# Patient Record
Sex: Male | Born: 1990 | Race: White | Hispanic: No | Marital: Single | Smoking: Former smoker
Health system: Southern US, Community
[De-identification: ages and names within clinical notes are randomized; demographics above are authoritative.]

## PROBLEM LIST (undated history)

## (undated) DIAGNOSIS — J45909 Unspecified asthma, uncomplicated: Secondary | ICD-10-CM

## (undated) DIAGNOSIS — E559 Vitamin D deficiency, unspecified: Secondary | ICD-10-CM

## (undated) DIAGNOSIS — R7303 Prediabetes: Secondary | ICD-10-CM

## (undated) DIAGNOSIS — K7010 Alcoholic hepatitis without ascites: Secondary | ICD-10-CM

## (undated) HISTORY — DX: Unspecified asthma, uncomplicated: J45.909

## (undated) HISTORY — PX: TONSILLECTOMY: SUR1361

## (undated) HISTORY — PX: ADENOIDECTOMY: SHX5191

## (undated) HISTORY — DX: Alcoholic hepatitis without ascites: K70.10

## (undated) HISTORY — DX: Prediabetes: R73.03

## (undated) HISTORY — DX: Vitamin D deficiency, unspecified: E55.9

---
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
## 2010-10-13 ENCOUNTER — Emergency Department (HOSPITAL_COMMUNITY)
Admission: EM | Admit: 2010-10-13 | Discharge: 2010-10-13 | Payer: Self-pay | Source: Home / Self Care | Admitting: Family Medicine

## 2011-02-06 NOTE — Op Note (Signed)
NAME:  Manuel Thomas, Manuel Thomas                ACCOUNT NO.:  [id number]   MEDICAL RECORD NO.:  [id number]          PATIENT TYPE:  OBV   LOCATION:  2550                         FACILITY:  [facility]   PHYSICIAN:  Dyke Brackett, [name]    DATE OF BIRTH:  18-Jan-1991   DATE OF PROCEDURE:  12/21/2005  DATE OF DISCHARGE:                                 OPERATIVE REPORT   INDICATIONS FOR PROCEDURE:  Patient presented to the ER with displaced  severe both bones fracture, thought to be amenable to reduction under  anesthesia.  When anesthesia was administered, the splint was removed.  There was a very small punctate wound over the ulna consistent with a very  minor open fracture.  For this reason, I&D was carried out as well.   Initially, provisional reduction, sterile prep and drape, irrigation of a  fracture site over the ulna with the skin being opened additionally about 1  cm.  He had about 1 liter of fluid irrigated into the wound with closure  over 4-0 nylon.  In the fracture itself, he was skeletally immature.  The  fracture could be reduced almost anatomically.  The radius was somewhat  perched at 50 to 75% reduction on the lateral but basically anatomically on  the AP and ulna was anatomically reduced with slight deviation.  It is my  feeling that the patient had acceptable reduction.  He was 14 but had wide  open vices and then this was deemed to be acceptable and splints were  applied with slight flexion and ulnar deviation.  Again, reduction was  confirmed to be acceptable under anesthesia with the splint on and again, a  sterile dressing was placed over the punctate wound, taken to the recovery  room in stable condition.      Dyke Brackett, [name]  Electronically Signed     WDC/MEDQ  D:  12/21/2005  T:  12/23/2005  Job:  [id number]

## 2011-02-06 NOTE — Op Note (Signed)
[place]  Patient:    Manuel Thomas, Manuel Thomas Visit Number: [id number] MRN: [id number]          Service Type: DSU Location: [facility] Attending Physician:  Manuel Thomas Dictated by:   Hyman Bible Pendse, [name] Proc. Date: 10/27/01 Admit Date:  10/27/2001   CC:         Weber Cooks Chestine Spore, [name]                           Operative Report  PREOPERATIVE DIAGNOSIS:  Right indirect inguinal hernia.  POSTOPERATIVE DIAGNOSIS:  Right indirect inguinal hernia.  OPERATION PERFORMED:  Repair of right indirect inguinal hernia.  SURGEON:  Prabhakar D. Levie Heritage, [name]  ASSISTANT:  Nurse.  ANESTHESIA:  Nurse.  DESCRIPTION OF PROCEDURE:  Under satisfactory general anesthesia, patient in supine position, the abdomen and groin regions were thoroughly prepped and draped in the usual manner.  A 2.5 cm long transverse incision was made in the right groin in the distal skin crease.  The skin and subcutaneous tissue were incised.  Bleeders were individually clamped, cut and electrocoagulated. External oblique opened.  The spermatic cord structures were dissected to isolate the indirect inguinal hernia sac.  There was an omental tissue in the sac which was reduced.  The sac was isolated up to its high point, doubly suture ligated with 4-0 silk and excess of the sac was excised.  Hernia repair was carried out by modified Fergusons method with #35 wire interrupted sutures.  A small tear in the external oblique was repaired with 4-0 Vicryl. 0.25% Marcaine with epinephrine was injected locally for postoperative analgesia.  Subcutaneous tissues apposed with 4-0 Vicryl.  Skin closed with 5-0 Monocryl subcuticular sutures.  Steri-Strips applied.  Throughout the procedure, the patients vital signs remained stable.  The patient withstood the procedure well and was transferred to the recovery room in satisfactory general condition. Dictated by:   Hyman Bible Pendse,  [name] Attending Physician:  Manuel Thomas DD:  10/27/01 TD:  10/27/01 Job: [id number]

## 2014-01-03 ENCOUNTER — Encounter: Payer: Self-pay | Admitting: Internal Medicine

## 2014-02-17 DIAGNOSIS — E559 Vitamin D deficiency, unspecified: Secondary | ICD-10-CM | POA: Insufficient documentation

## 2014-02-17 DIAGNOSIS — J45909 Unspecified asthma, uncomplicated: Secondary | ICD-10-CM | POA: Insufficient documentation

## 2014-02-19 ENCOUNTER — Ambulatory Visit (INDEPENDENT_AMBULATORY_CARE_PROVIDER_SITE_OTHER): Payer: 59 | Admitting: Internal Medicine

## 2014-02-19 ENCOUNTER — Encounter: Payer: Self-pay | Admitting: Internal Medicine

## 2014-02-19 VITALS — BP 102/76 | HR 68 | Temp 99.7°F | Resp 16 | Ht 70.0 in | Wt 135.8 lb

## 2014-02-19 DIAGNOSIS — Z111 Encounter for screening for respiratory tuberculosis: Secondary | ICD-10-CM

## 2014-02-19 DIAGNOSIS — E559 Vitamin D deficiency, unspecified: Secondary | ICD-10-CM

## 2014-02-19 DIAGNOSIS — Z0000 Encounter for general adult medical examination without abnormal findings: Secondary | ICD-10-CM

## 2014-02-19 DIAGNOSIS — R7402 Elevation of levels of lactic acid dehydrogenase (LDH): Secondary | ICD-10-CM

## 2014-02-19 DIAGNOSIS — Z1212 Encounter for screening for malignant neoplasm of rectum: Secondary | ICD-10-CM

## 2014-02-19 DIAGNOSIS — Z113 Encounter for screening for infections with a predominantly sexual mode of transmission: Secondary | ICD-10-CM

## 2014-02-19 DIAGNOSIS — R740 Nonspecific elevation of levels of transaminase and lactic acid dehydrogenase [LDH]: Secondary | ICD-10-CM

## 2014-02-19 LAB — BASIC METABOLIC PANEL WITH GFR
BUN: 9 mg/dL (ref 6–23)
CO2: 25 meq/L (ref 19–32)
Calcium: 10.1 mg/dL (ref 8.4–10.5)
Chloride: 103 mEq/L (ref 96–112)
Creat: 1.03 mg/dL (ref 0.50–1.35)
GFR, Est Non African American: >89 mL/min
Glucose, Bld: 91 mg/dL (ref 70–99)
POTASSIUM: 4.1 meq/L (ref 3.5–5.3)
Sodium: 139 mEq/L (ref 135–145)

## 2014-02-19 LAB — LIPID PANEL
CHOLESTEROL: 188 mg/dL (ref 0–200)
HDL: 69 mg/dL (ref >39)
LDL CALC: 88 mg/dL (ref 0–99)
TRIGLYCERIDES: 154 mg/dL — AB (ref <150)
Total CHOL/HDL Ratio: 2.7 Ratio
VLDL: 31 mg/dL (ref 0–40)

## 2014-02-19 LAB — CBC WITH DIFFERENTIAL/PLATELET
Basophils Absolute: 0.1 10*3/uL (ref 0.0–0.1)
Basophils Relative: 1 % (ref 0–1)
EOS ABS: 0.1 10*3/uL (ref 0.0–0.7)
EOS PCT: 2 % (ref 0–5)
HCT: 45.7 % (ref 39.0–52.0)
HEMOGLOBIN: 16.1 g/dL (ref 13.0–17.0)
LYMPHS ABS: 2.2 10*3/uL (ref 0.7–4.0)
LYMPHS PCT: 30 % (ref 12–46)
MCH: 30.8 pg (ref 26.0–34.0)
MCHC: 35.2 g/dL (ref 30.0–36.0)
MCV: 87.5 fL (ref 78.0–100.0)
MONOS PCT: 9 % (ref 3–12)
Monocytes Absolute: 0.7 10*3/uL (ref 0.1–1.0)
NEUTROS ABS: 4.3 10*3/uL (ref 1.7–7.7)
Neutrophils Relative %: 58 % (ref 43–77)
PLATELETS: 266 10*3/uL (ref 150–400)
RBC: 5.22 MIL/uL (ref 4.22–5.81)
RDW: 13.3 % (ref 11.5–15.5)
WBC: 7.4 10*3/uL (ref 4.0–10.5)

## 2014-02-19 LAB — HEPATIC FUNCTION PANEL
ALK PHOS: 64 U/L (ref 39–117)
ALT: 17 U/L (ref 0–53)
AST: 23 U/L (ref 0–37)
Albumin: 4.9 g/dL (ref 3.5–5.2)
BILIRUBIN DIRECT: 0.2 mg/dL (ref 0.0–0.3)
Indirect Bilirubin: 0.6 mg/dL (ref 0.2–1.2)
TOTAL PROTEIN: 7.4 g/dL (ref 6.0–8.3)
Total Bilirubin: 0.8 mg/dL (ref 0.2–1.2)

## 2014-02-19 LAB — VITAMIN B12: Vitamin B-12: 396 pg/mL (ref 211–911)

## 2014-02-19 LAB — TSH: TSH: 1.068 u[IU]/mL (ref 0.350–4.500)

## 2014-02-19 LAB — MAGNESIUM: MAGNESIUM: 2 mg/dL (ref 1.5–2.5)

## 2014-02-19 NOTE — Patient Instructions (Signed)

## 2014-02-19 NOTE — Progress Notes (Signed)
Patient ID: Manuel Thomas, male   DOB: [birth year], 23 y.o.   MRN: [id number]   Annual Screening Comprehensive Examination   This very nice 23 y.o.male presents for complete physical.  Patient has no major health issues.    Finally, patient has history of Vitamin D Deficiency with last vitamin D 23 in 2012 and 20 in 2014 .  No Meds or supplements  No known allergies  Past Medical History  Diagnosis Date  . Asthma   . Prediabetes   . Vitamin D deficiency    Past Surgical History  Procedure Laterality Date  . Tonsillectomy    . Adenoidectomy    . Inguinal hernia repair  2003  . Closed reduction forearm fracture Left 2005   Family History  Problem Relation Age of Onset  . Heart disease Mother   . Heart attack Other   . Arthritis Other   . Hypertension Other   . Diabetes Other   . Stroke Other    History   Social History  . Marital Status: Single    Spouse Name: N/A    Number of Children: N/A  . 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   Social History Main Topics  . Smoking status: Never Smoker   . Smokeless tobacco: Not on file  . Alcohol Use: No  . Drug Use: No  . Sexual Activity: Not on file    ROS Constitutional: Denies fever, chills, weight loss/gain, headaches, insomnia, fatigue, night sweats, and change in appetite. Eyes: Denies redness, blurred vision, diplopia, discharge, itchy, watery eyes.  ENT: Denies discharge, congestion, post nasal drip, epistaxis, sore throat, earache, hearing loss, dental pain, Tinnitus, Vertigo, Sinus pain, snoring.  Cardio: Denies chest pain, palpitations, irregular heartbeat, syncope, dyspnea, diaphoresis, orthopnea, PND, claudication, edema Respiratory: denies cough, dyspnea, DOE, pleurisy, hoarseness, laryngitis, wheezing.  Gastrointestinal: Denies dysphagia, heartburn, reflux, water brash, pain, cramps, nausea, vomiting, bloating, diarrhea, constipation,  hematemesis, melena, hematochezia, jaundice, hemorrhoids Genitourinary: Denies dysuria, frequency, urgency, nocturia, hesitancy, discharge, hematuria, flank pain Breast: Breast lumps, nipple discharge, bleeding.  Musculoskeletal: Denies arthralgia, myalgia, stiffness, Jt. Swelling, pain, limp, and strain/sprain. Skin: Denies puritis, rash, hives, warts, acne, eczema, changing in skin lesion Neuro: Weakness, tremor, incoordination, spasms, paresthesia, pain Psychiatric: Denies confusion, memory loss, sensory loss. Endocrine: Denies change in weight, skin, hair change, nocturia, and paresthesia, diabetic polys, visual blurring, hyper /hypo glycemic episodes.  Heme/Lymph: No excessive bleeding, bruising, enlarged lymph nodes.   Physical Exam  There were no vitals taken for this visit.  General Appearance: Well nourished, in no apparent distress. Eyes: PERRLA, EOMs, conjunctiva no swelling or erythema, normal fundi and vessels. Sinuses: No frontal/maxillary tenderness ENT/Mouth: EACs patent / TMs  nl. Nares clear without erythema, swelling, mucoid exudates. Oral hygiene is good. No erythema, swelling, or exudate. Tongue normal, non-obstructing. Tonsils not swollen or erythematous. Hearing normal.  Neck: Supple, thyroid normal. No bruits, nodes or JVD. Respiratory: Respiratory effort normal.  BS equal and clear bilateral without rales, rhonci, wheezing or stridor. Cardio: Heart sounds are normal with regular rate and rhythm and no murmurs, rubs or gallops. Peripheral pulses are normal and equal bilaterally without edema. No aortic or femoral bruits. Chest: symmetric with normal excursions and percussion. Breasts: Symmetric, without lumps, nipple discharge, retractions, or fibrocystic changes.  Abdomen: Flat, soft, with bowl sounds. Nontender, no guarding, rebound, hernias, masses, or organomegaly.  Lymphatics: Non tender without lymphadenopathy.  Genitourinary:  Musculoskeletal: Full ROM all  peripheral extremities, joint stability, 5/5 strength, and normal gait. Skin: Warm and dry without rashes, lesions, cyanosis, clubbing or  ecchymosis.  Neuro: Cranial nerves intact, reflexes equal bilaterally. Normal muscle tone, no cerebellar symptoms. Sensation intact.  Pysch: Awake and oriented X 3, normal affect, Insight and Judgment appropriate.   Assessment and Plan  1. Annual Screening Examination 2. Vitamin D Deficiency  Continue prudent diet as discussed, weight control, regular exercise, and medications. Routine screening labs and tests as requested with regular follow-up as recommended.

## 2014-02-20 LAB — HEMOGLOBIN A1C
Hgb A1c MFr Bld: 5.3 % (ref <5.7)
MEAN PLASMA GLUCOSE: 105 mg/dL (ref <117)

## 2014-02-20 LAB — HEPATITIS A ANTIBODY, TOTAL: HEP A TOTAL AB: BORDERLINE — AB

## 2014-02-20 LAB — MICROALBUMIN / CREATININE URINE RATIO
Creatinine, Urine: 65.6 mg/dL
MICROALB/CREAT RATIO: 7.6 mg/g (ref 0.0–30.0)
Microalb, Ur: 0.5 mg/dL (ref 0.00–1.89)

## 2014-02-20 LAB — HEPATITIS C ANTIBODY: HCV AB: NEGATIVE

## 2014-02-20 LAB — TESTOSTERONE: TESTOSTERONE: 642 ng/dL (ref 300–890)

## 2014-02-20 LAB — INSULIN, FASTING: Insulin fasting, serum: 6 u[IU]/mL (ref 3–28)

## 2014-02-20 LAB — URINALYSIS, MICROSCOPIC ONLY
Bacteria, UA: NONE SEEN
Casts: NONE SEEN
Crystals: NONE SEEN
SQUAMOUS EPITHELIAL / LPF: NONE SEEN

## 2014-02-20 LAB — HIV ANTIBODY (ROUTINE TESTING W REFLEX): HIV: NONREACTIVE

## 2014-02-20 LAB — RPR

## 2014-02-20 LAB — HEPATITIS B SURFACE ANTIBODY,QUALITATIVE: HEP B S AB: POSITIVE — AB

## 2014-02-20 LAB — HEPATITIS B CORE ANTIBODY, TOTAL: HEP B C TOTAL AB: NONREACTIVE

## 2014-02-20 LAB — VITAMIN D 25 HYDROXY (VIT D DEFICIENCY, FRACTURES): Vit D, 25-Hydroxy: 32 ng/mL (ref 30–89)

## 2014-02-21 LAB — HEPATITIS B E ANTIBODY: Hepatitis Be Antibody: NONREACTIVE

## 2014-03-07 LAB — TB SKIN TEST
Induration: 0 mm
TB SKIN TEST: NEGATIVE

## 2014-11-06 ENCOUNTER — Emergency Department (HOSPITAL_COMMUNITY)
Admission: EM | Admit: 2014-11-06 | Discharge: 2014-11-06 | Disposition: A | Discharge status: Home / Self Care | Payer: 59 | Source: Home / Self Care | Attending: Family Medicine | Admitting: Family Medicine

## 2014-11-06 ENCOUNTER — Emergency Department (INDEPENDENT_AMBULATORY_CARE_PROVIDER_SITE_OTHER): Payer: 59

## 2014-11-06 ENCOUNTER — Encounter (HOSPITAL_COMMUNITY): Payer: Self-pay | Admitting: *Deleted

## 2014-11-06 DIAGNOSIS — S92251A Displaced fracture of navicular [scaphoid] of right foot, initial encounter for closed fracture: Secondary | ICD-10-CM

## 2014-11-06 NOTE — Discharge Instructions (Signed)
You have fractured your navicular bone, which is one of the bones of the wrist. Please use ibuprofen and ice regularly for the next 48 hours. Please call [facility] for a follow-up appointment for your hand/wrist. This group has specific hand specialist. Pelase wear the brace continually until your follow-up appointment.

## 2014-11-06 NOTE — ED Provider Notes (Signed)
CSN: [id number]     Arrival date & time 11/06/14  1000 History   First MD Initiated Contact with Patient 11/06/14 1100     Chief Complaint  Patient presents with  . Wrist Injury   (Consider location/radiation/quality/duration/timing/severity/associated sxs/prior Treatment) HPI  R wrist injury: fell 1 day ago. Larey SeatFell backwards and put out hand to catch self. Immediately painful. Some swelling. Ice and ibuprofen w/ some relief. Pain is constant. Unable to fully flex or extend wrist since injury due to pain. H/o 3 L arm fractures from sports in the past.    Past Medical History  Diagnosis Date  . Asthma   . Prediabetes   . Vitamin D deficiency    Past Surgical History  Procedure Laterality Date  . Tonsillectomy    . Adenoidectomy    . Inguinal hernia repair  2003  . Closed reduction forearm fracture Left 2005   Family History  Problem Relation Age of Onset  . Heart disease Mother   . Heart attack Other   . Arthritis Other   . Hypertension Other   . Diabetes Other   . Stroke Other    History  Substance Use Topics  . Smoking status: Never Smoker   . Smokeless tobacco: Not on file  . Alcohol Use: No    Review of Systems Per HPI with all other pertinent systems negative.   Allergies  Review of patient's allergies indicates no known allergies.  Home Medications   Prior to Admission medications   Not on File   There were no vitals taken for this visit. Physical Exam  Constitutional: He is oriented to person, place, and time. He appears well-developed and well-nourished. No distress.  HENT:  Head: Normocephalic and atraumatic.  Eyes: EOM are normal. Pupils are equal, round, and reactive to light.  Neck: Normal range of motion.  Cardiovascular: Normal rate, normal heart sounds and intact distal pulses.   No murmur heard. Pulmonary/Chest: Effort normal and breath sounds normal.  Abdominal: Soft. Bowel sounds are normal.  Musculoskeletal: Normal range of motion.  R  wrist w/ effusion w/ ttp along the dorsal aspect. Grip strength 4/5 due to pain  Neurological: He is alert and oriented to person, place, and time.  Skin: He is not diaphoretic.  Psychiatric: He has a normal mood and affect. His behavior is normal. Judgment and thought content normal.    ED Course  Procedures (including critical care time) Labs Review Labs Reviewed - No data to display  Imaging Review Dg Wrist Complete Right  11/06/2014   CLINICAL DATA:  Fall yesterday.  Wrist pain  EXAM: RIGHT WRIST - COMPLETE 3+ VIEW  COMPARISON:  None.  FINDINGS: Nondisplaced acute fracture of the proximal pole of the navicular bone. No other fracture. No joint space narrowing or erosion.  IMPRESSION: Nondisplaced fracture proximal pole of the navicular bone.   Electronically Signed   By: Marlan Palauharles  Clark [name]   On: 11/06/2014 11:25     MDM   1. Navicular fracture, right, closed, initial encounter    Ice, NSAIDs, placed in Thumb spica. F/u [place] ortho Dr Amanda PeaGramig.   Precautions given and all questions answered  Shelly Flattenavid Merrell, MD Family Medicine 11/06/2014, 11:43 AM      Ozella Rocksavid J Merrell, MD 11/06/14 [phone]

## 2014-11-06 NOTE — ED Notes (Signed)
Pt  Fell   On  [place]  Ice     yest     -       He has pain  And  Swelling  To  The  r    Wrist        -       He  denys   Any  Other injurys

## 2014-11-19 ENCOUNTER — Other Ambulatory Visit: Payer: Self-pay | Admitting: Orthopedic Surgery

## 2014-11-19 ENCOUNTER — Other Ambulatory Visit: Payer: 59

## 2014-11-19 ENCOUNTER — Ambulatory Visit
Admission: RE | Admit: 2014-11-19 | Discharge: 2014-11-19 | Disposition: A | Discharge status: Home / Self Care | Payer: 59 | Source: Ambulatory Visit | Attending: Orthopedic Surgery | Admitting: Orthopedic Surgery

## 2014-11-19 DIAGNOSIS — S62034D Nondisplaced fracture of proximal third of navicular [scaphoid] bone of right wrist, subsequent encounter for fracture with routine healing: Secondary | ICD-10-CM

## 2015-02-21 ENCOUNTER — Encounter: Payer: Self-pay | Admitting: Internal Medicine

## 2015-02-27 ENCOUNTER — Encounter: Payer: Self-pay | Admitting: Internal Medicine

## 2015-04-29 ENCOUNTER — Encounter: Payer: Self-pay | Admitting: Internal Medicine

## 2015-04-29 ENCOUNTER — Ambulatory Visit (INDEPENDENT_AMBULATORY_CARE_PROVIDER_SITE_OTHER): Payer: 59 | Admitting: Internal Medicine

## 2015-04-29 VITALS — BP 126/76 | HR 60 | Temp 97.5°F | Resp 16 | Ht 70.5 in | Wt 147.6 lb

## 2015-04-29 DIAGNOSIS — R6889 Other general symptoms and signs: Secondary | ICD-10-CM

## 2015-04-29 DIAGNOSIS — E559 Vitamin D deficiency, unspecified: Secondary | ICD-10-CM

## 2015-04-29 DIAGNOSIS — Z111 Encounter for screening for respiratory tuberculosis: Secondary | ICD-10-CM

## 2015-04-29 DIAGNOSIS — R7303 Prediabetes: Secondary | ICD-10-CM

## 2015-04-29 DIAGNOSIS — Z6820 Body mass index (BMI) 20.0-20.9, adult: Secondary | ICD-10-CM

## 2015-04-29 DIAGNOSIS — Z0001 Encounter for general adult medical examination with abnormal findings: Secondary | ICD-10-CM

## 2015-04-29 DIAGNOSIS — R7309 Other abnormal glucose: Secondary | ICD-10-CM | POA: Diagnosis not present

## 2015-04-29 LAB — CBC WITH DIFFERENTIAL/PLATELET
BASOS ABS: 0.1 10*3/uL (ref 0.0–0.1)
BASOS PCT: 1 % (ref 0–1)
EOS ABS: 0.3 10*3/uL (ref 0.0–0.7)
Eosinophils Relative: 4 % (ref 0–5)
HCT: 46.7 % (ref 39.0–52.0)
HEMOGLOBIN: 15.9 g/dL (ref 13.0–17.0)
LYMPHS PCT: 36 % (ref 12–46)
Lymphs Abs: 2.9 10*3/uL (ref 0.7–4.0)
MCH: 31 pg (ref 26.0–34.0)
MCHC: 34 g/dL (ref 30.0–36.0)
MCV: 91 fL (ref 78.0–100.0)
MPV: 10.5 fL (ref 8.6–12.4)
Monocytes Absolute: 0.9 10*3/uL (ref 0.1–1.0)
Monocytes Relative: 11 % (ref 3–12)
Neutro Abs: 3.8 10*3/uL (ref 1.7–7.7)
Neutrophils Relative %: 48 % (ref 43–77)
Platelets: 273 10*3/uL (ref 150–400)
RBC: 5.13 MIL/uL (ref 4.22–5.81)
RDW: 13.5 % (ref 11.5–15.5)
WBC: 8 10*3/uL (ref 4.0–10.5)

## 2015-04-29 LAB — BASIC METABOLIC PANEL WITH GFR
BUN: 8 mg/dL (ref 7–25)
CO2: 24 mmol/L (ref 20–31)
Calcium: 9.6 mg/dL (ref 8.6–10.3)
Chloride: 103 mmol/L (ref 98–110)
Creat: 1 mg/dL (ref 0.60–1.35)
GFR, Est African American: >89 mL/min (ref >=60)
GFR, Est Non African American: >89 mL/min (ref >=60)
Glucose, Bld: 78 mg/dL (ref 65–99)
POTASSIUM: 4.3 mmol/L (ref 3.5–5.3)
Sodium: 138 mmol/L (ref 135–146)

## 2015-04-29 NOTE — Progress Notes (Signed)
Patient ID: Manuel Thomas, male   DOB: [birth year], 24 y.o.   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  Exercises 2 x week. Patient has no major health issues.    Finally, patient has history of Vitamin D Deficiency and last vitamin D was low at 19 in 2012 and  32 in June 2015.    Meds: Vit D 5,000 units daily  Allergies  Allergen Reactions  . Dilaudid [Hydromorphone Hcl]    Past Medical History  Diagnosis Date  . Asthma   . Prediabetes   . Vitamin D deficiency    Immunization History  Administered Date(s) Administered  . PPD Test 02/19/2014  . Td 08/21/2012   Past Surgical History  Procedure Laterality Date  . Tonsillectomy    . Adenoidectomy    . Inguinal hernia repair  2003  . Closed reduction forearm fracture Left 2005   Family History  Problem Relation Age of Onset  . Heart disease Mother   . Heart attack Other   . Arthritis Other   . Hypertension Other   . Diabetes Other   . Stroke Other    History   Social History  . Marital Status: Single    Spouse Name: N/A  . Number of Children: N/A  . 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    Social History Main Topics  . Smoking status: Never Smoker   . Smokeless tobacco: Not on file  . Alcohol Use: No  . Drug Use: No  . Sexual Activity: Not on file    ROS Constitutional: Denies fever, chills, weight loss/gain, headaches, insomnia, fatigue, night sweats, and change in appetite. Eyes: Denies redness, blurred vision, diplopia, discharge, itchy, watery eyes.  ENT: Denies discharge, congestion, post nasal drip, epistaxis, sore throat, earache, hearing loss, dental pain, Tinnitus, Vertigo, Sinus pain, snoring.  Cardio: Denies chest pain, palpitations, irregular heartbeat, syncope, dyspnea, diaphoresis, orthopnea, PND, claudication,  edema Respiratory: denies cough, dyspnea, DOE, pleurisy, hoarseness, laryngitis, wheezing.  Gastrointestinal: Denies dysphagia, heartburn, reflux, water brash, pain, cramps, nausea, vomiting, bloating, diarrhea, constipation, hematemesis, melena, hematochezia, jaundice, hemorrhoids Genitourinary: Denies dysuria, frequency, urgency, nocturia, hesitancy, discharge, hematuria, flank pain Breast: Breast lumps, nipple discharge, bleeding.  Musculoskeletal: Denies arthralgia, myalgia, stiffness, Jt. Swelling, pain, limp, and strain/sprain. Skin: Denies puritis, rash, hives, warts, acne, eczema, changing in skin lesion Neuro: Weakness, tremor, incoordination, spasms, paresthesia, pain Psychiatric: Denies confusion, memory loss, sensory loss. Endocrine: Denies change in weight, skin, hair change, nocturia, and paresthesia, diabetic polys, visual blurring, hyper /hypo glycemic episodes.  Heme/Lymph: No excessive bleeding, bruising, enlarged lymph nodes.   Physical Exam  BP 126/76   Pulse 60  Temp 97.5 F   Resp 16  Ht 5' 10.5"   Wt 147 lb 9.6 oz     BMI 20.87  General Appearance: Well nourished and in no apparent distress. Eyes: PERRLA, EOMs, conjunctiva no swelling or erythema, normal fundi and vessels. Sinuses: No frontal/maxillary tenderness ENT/Mouth: EACs patent / TMs  nl. Nares clear without erythema, swelling, mucoid exudates. Oral hygiene is good. No erythema, swelling, or exudate. Tongue normal, non-obstructing. Tonsils not swollen or erythematous. Hearing normal.  Neck: Supple, thyroid normal. No bruits, nodes or JVD. Respiratory: Respiratory effort normal.  BS equal and clear bilateral without rales, rhonci, wheezing or stridor. Cardio: Heart sounds are  normal with regular rate and rhythm and no murmurs, rubs or gallops. Peripheral pulses are normal and equal bilaterally without edema. No aortic or femoral bruits. Chest: symmetric with normal excursions and percussion. Abdomen: Flat,  soft, with bowl sounds. Nontender, no guarding, rebound, hernias, masses, or organomegaly.  Lymphatics: Non tender without lymphadenopathy.  Genitourinary: No Hernia . Nl Male  & testes Nl/Unremarkable.  Musculoskeletal: Full ROM all peripheral extremities, joint stability, 5/5 strength, and normal gait. Skin: Warm and dry without rashes, lesions, cyanosis, clubbing or  ecchymosis.  Neuro: Cranial nerves intact, reflexes equal bilaterally. Normal muscle tone, no cerebellar symptoms. Sensation intact.  Pysch: Awake and oriented X 3, normal affect, Insight and Judgment appropriate.   Assessment and Plan   1. Encounter for general adult medical examination with abnormal findings  - Urine Microscopic - CBC with Differential/Platelet - BASIC METABOLIC PANEL WITH GFR  2. Vitamin D deficiency  - Vit D  25 hydroxy   3. Screening examination for pulmonary tuberculosis  - TB Skin Test  4. Body mass index (BMI) of 20.0-20.9    5. Prediabetes  Continue prudent diet as discussed, weight control, regular exercise, and medications. Routine screening labs and tests as requested with regular follow-up as recommended.Over 30 minutes of exam, counseling, chart review and critical decision making was performed

## 2015-04-29 NOTE — Patient Instructions (Signed)
Recommend Adult Low dose Aspirin or coated  Aspirin 81 mg daily   To reduce risk of Colon Cancer 20 %,   Skin Cancer 26 % ,   Melanoma 46%   and   Pancreatic cancer 60% ++++++++++++++++++ Vitamin D goal is between 70-100.   Please make sure that you are taking your Vitamin D as directed.   It is very important as a natural anti-inflammatory   helping hair, skin, and nails, as well as reducing stroke and heart attack risk.   It helps your bones and helps with mood.  It also decreases numerous cancer risks so please take it as directed.   Low Vit D is associated with a 200-300% higher risk for CANCER   and 200-300% higher risk for HEART   ATTACK  &  STROKE.   .....................................Marland Kitchen  It is also associated with higher death rate at younger ages,   autoimmune diseases like Rheumatoid arthritis, Lupus, Multiple Sclerosis.     Also many other serious conditions, like depression, Alzheimer's  Dementia, infertility, muscle aches, fatigue, fibromyalgia - just to name a few.  +++++++++++++++++++  Recommend the book "The END of DIETING" by Dr Excell Seltzer   & the book "The END of DIABETES " by Dr Excell Seltzer  At [email or web address] - get book & Audio CD's     Being diabetic has a  300% increased risk for heart attack, stroke, cancer, and alzheimer- type vascular dementia. It is very important that you work harder with diet by avoiding all foods that are white. Avoid white rice (brown & wild rice is OK), white potatoes (sweetpotatoes in moderation is OK), White bread or wheat bread or anything made out of white flour like bagels, donuts, rolls, buns, biscuits, cakes, pastries, cookies, pizza crust, and pasta (made from white flour & egg whites) - vegetarian pasta or spinach or wheat pasta is OK. Multigrain breads like Arnold's or Pepper[employer], or multigrain sandwich thins or flatbreads.  Diet, exercise and weight loss can reverse and cure diabetes in the early stages.   Diet, exercise and weight loss is very important in the control and prevention of complications of diabetes which affects every system in your body, ie. Brain - dementia/stroke, eyes - glaucoma/blindness, heart - heart attack/heart failure, kidneys - dialysis, stomach - gastric paralysis, intestines - malabsorption, nerves - severe painful neuritis, circulation - gangrene & loss of a leg(s), and finally cancer and Alzheimers.    I recommend avoid fried & greasy foods,  sweets/candy, white rice (brown or wild rice or Quinoa is OK), white potatoes (sweet potatoes are OK) - anything made from white flour - bagels, doughnuts, rolls, buns, biscuits,white and wheat breads, pizza crust and traditional pasta made of white flour & egg white(vegetarian pasta or spinach or wheat pasta is OK).  Multi-grain bread is OK - like multi-grain flat bread or sandwich thins. Avoid alcohol in excess. Exercise is also important.    Eat all the vegetables you want - avoid meat, especially red meat and dairy - especially cheese.  Cheese is the most concentrated form of trans-fats which is the worst thing to clog up our arteries. Veggie cheese is OK which can be found in the fresh produce section at [facility] or [employer] or [employer]  ++++++++++++++++++++++++++   Preventive Care for Adults  A healthy lifestyle and preventive care can promote health and wellness. Preventive health guidelines for men include the following key practices:  A routine yearly physical is a good way  to check with your health care provider about your health and preventative screening. It is a chance to share any concerns and updates on your health and to receive a thorough exam.  Visit your dentist for a routine exam and preventative care every 6 months. Brush your teeth twice a day and floss once a day. Good oral hygiene prevents tooth decay and gum disease.  The frequency of eye exams is based on your age, health, family medical history, use  of contact lenses, and other factors. Follow your health care provider's recommendations for frequency of eye exams.  Eat a healthy diet. Foods such as vegetables, fruits, whole grains, low-fat dairy products, and lean protein foods contain the nutrients you need without too many calories. Decrease your intake of foods high in solid fats, added sugars, and salt. Eat the right amount of calories for you.Get information about a proper diet from your health care provider, if necessary.  Regular physical exercise is one of the most important things you can do for your health. Most adults should get at least 150 minutes of moderate-intensity exercise (any activity that increases your heart rate and causes you to sweat) each week. In addition, most adults need muscle-strengthening exercises on 2 or more days a week.  Maintain a healthy weight. The body mass index (BMI) is a screening tool to identify possible weight problems. It provides an estimate of body fat based on height and weight. Your health care provider can find your BMI and can help you achieve or maintain a healthy weight.For adults 20 years and older:  A BMI below 18.5 is considered underweight.  A BMI of 18.5 to 24.9 is normal.  A BMI of 25 to 29.9 is considered overweight.  A BMI of 30 and above is considered obese.  Maintain normal blood lipids and cholesterol levels by exercising and minimizing your intake of saturated fat. Eat a balanced diet with plenty of fruit and vegetables. Blood tests for lipids and cholesterol should begin at age 32 and be repeated every 5 years. If your lipid or cholesterol levels are high, you are over 50, or you are at high risk for heart disease, you may need your cholesterol levels checked more frequently.Ongoing high lipid and cholesterol levels should be treated with medicines if diet and exercise are not working.  If you smoke, find out from your health care provider how to quit. If you do not use  tobacco, do not start.  Lung cancer screening is recommended for adults aged 74-80 years who are at high risk for developing lung cancer because of a history of smoking. A yearly low-dose CT scan of the lungs is recommended for people who have at least a 30-pack-year history of smoking and are a current smoker or have quit within the past 15 years. A pack year of smoking is smoking an average of 1 pack of cigarettes a day for 1 year (for example: 1 pack a day for 30 years or 2 packs a day for 15 years). Yearly screening should continue until the smoker has stopped smoking for at least 15 years. Yearly screening should be stopped for people who develop a health problem that would prevent them from having lung cancer treatment.  If you choose to drink alcohol, do not have more than 2 drinks per day. One drink is considered to be 12 ounces (355 mL) of beer, 5 ounces (148 mL) of wine, or 1.5 ounces (44 mL) of liquor.  High blood pressure  causes heart disease and increases the risk of stroke. Your blood pressure should be checked. Ongoing high blood pressure should be treated with medicines, if weight loss and exercise are not effective.  If you are 45-79 years old, ask your health care provider if you should take aspirin to prevent heart disease.  Diabetes screening involves taking a blood sample to check your fasting blood sugar level. Testing should be considered at a younger age or be carried out more frequently if you are overweight and have at least 1 risk factor for diabetes.  Colorectal cancer can be detected and often prevented. Most routine colorectal cancer screening begins at the age of 50 and continues through age 75. However, your health care provider may recommend screening at an earlier age if you have risk factors for colon cancer. On a yearly basis, your health care provider may provide home test kits to check for hidden blood in the stool. Use of a small camera at the end of a tube to  directly examine the colon (sigmoidoscopy or colonoscopy) can detect the earliest forms of colorectal cancer. Talk to your health care provider about this at age 50, when routine screening begins. Direct exam of the colon should be repeated every 5-10 years through age 75, unless early forms of precancerous polyps or small growths are found.  Screening for abdominal aortic aneurysm (AAA)  are recommended for persons over age 50 who have history of hypertensionor who are current or former smokers.  Talk with your health care provider about prostate cancer screening.  Testicular cancer screening is recommended for adult males. Screening includes self-exam, a health care provider exam, and other screening tests. Consult with your health care provider about any symptoms you have or any concerns you have about testicular cancer.  Use sunscreen. Apply sunscreen liberally and repeatedly throughout the day. You should seek shade when your shadow is shorter than you. Protect yourself by wearing long sleeves, pants, a wide-brimmed hat, and sunglasses year round, whenever you are outdoors.  Once a month, do a whole-body skin exam, using a mirror to look at the skin on your back. Tell your health care provider about new moles, moles that have irregular borders, moles that are larger than a pencil eraser, or moles that have changed in shape or color.  Stay current with required vaccines (immunizations).  Influenza vaccine. All adults should be immunized every year.  Tetanus, diphtheria, and acellular pertussis (Td, Tdap) vaccine. An adult who has not previously received Tdap or who does not know his vaccine status should receive 1 dose of Tdap. This initial dose should be followed by tetanus and diphtheria toxoids (Td) booster doses every 10 years. Adults with an unknown or incomplete history of completing a 3-dose immunization series with Td-containing vaccines should begin or complete a primary immunization  series including a Tdap dose. Adults should receive a Td booster every 10 years.  Zoster vaccine. One dose is recommended for adults aged 60 years or older unless certain conditions are present.    Pneumococcal 13-valent conjugate (PCV13) vaccine. When indicated, a person who is uncertain of his immunization history and has no record of immunization should receive the PCV13 vaccine. An adult aged 19 years or older who has certain medical conditions and has not been previously immunized should receive 1 dose of PCV13 vaccine. This PCV13 should be followed with a dose of pneumococcal polysaccharide (PPSV23) vaccine. The PPSV23 vaccine dose should be obtained at least 8 weeks after the dose   of PCV13 vaccine. An adult aged 19 years or older who has certain medical conditions and previously received 1 or more doses of PPSV23 vaccine should receive 1 dose of PCV13. The PCV13 vaccine dose should be obtained 1 or more years after the last PPSV23 vaccine dose.    Pneumococcal polysaccharide (PPSV23) vaccine. When PCV13 is also indicated, PCV13 should be obtained first. All adults aged 65 years and older should be immunized. An adult younger than age 65 years who has certain medical conditions should be immunized. Any person who resides in a nursing home or long-term care facility should be immunized. An adult smoker should be immunized. People with an immunocompromised condition and certain other conditions should receive both PCV13 and PPSV23 vaccines. People with human immunodeficiency virus (HIV) infection should be immunized as soon as possible after diagnosis. Immunization during chemotherapy or radiation therapy should be avoided. Routine use of PPSV23 vaccine is not recommended for American Indians, [place] Natives, or people younger than 65 years unless there are medical conditions that require PPSV23 vaccine. When indicated, people who have unknown immunization and have no record of immunization should  receive PPSV23 vaccine. One-time revaccination 5 years after the first dose of PPSV23 is recommended for people aged 19-64 years who have chronic kidney failure, nephrotic syndrome, asplenia, or immunocompromised conditions. People who received 1-2 doses of PPSV23 before age 65 years should receive another dose of PPSV23 vaccine at age 65 years or later if at least 5 years have passed since the previous dose. Doses of PPSV23 are not needed for people immunized with PPSV23 at or after age 65 years.  Hepatitis A vaccine. Adults who wish to be protected from this disease, have certain high-risk conditions, work with hepatitis A-infected animals, work in hepatitis A research labs, or travel to or work in countries with a high rate of hepatitis A should be immunized. Adults who were previously unvaccinated and who anticipate close contact with an international adoptee during the first 60 days after arrival in the [place] from a country with a high rate of hepatitis A should be immunized.  Hepatitis B vaccine. Adults should be immunized if they wish to be protected from this disease, have certain high-risk conditions, may be exposed to blood or other infectious body fluids, are household contacts or sex partners of hepatitis B positive people, are clients or workers in certain care facilities, or travel to or work in countries with a high rate of hepatitis B.  Preventive Service / Frequency  Ages 19 to 39  Blood pressure check.  Lipid and cholesterol check.  Hepatitis C blood test.** / For any individual with known risks for hepatitis C.  Skin self-exam. / Monthly.  Influenza vaccine. / Every year.  Tetanus, diphtheria, and acellular pertussis (Tdap, Td) vaccine.** / Consult your health care provider. 1 dose of Td every 10 years.  HPV vaccine. / 3 doses over 6 months, if 26 or younger.  Measles, mumps, rubella (MMR) vaccine.** / You need at least 1 dose of MMR if you were born in 1957 or  later. You may also need a second dose.  Pneumococcal 13-valent conjugate (PCV13) vaccine.** / Consult your health care provider.  Pneumococcal polysaccharide (PPSV23) vaccine.** / 1 to 2 doses if you smoke cigarettes or if you have certain conditions.  Meningococcal vaccine.** / 1 dose if you are age 19 to 21 years and a first-year college student living in a residence hall, or have one of several medical conditions. You   may also need additional booster doses.  Hepatitis A vaccine.** / Consult your health care provider.  Hepatitis B vaccine.** / Consult your health care provider.

## 2015-04-30 LAB — URINALYSIS, MICROSCOPIC ONLY
BACTERIA UA: NONE SEEN [HPF]
Casts: NONE SEEN [LPF]
Crystals: NONE SEEN [HPF]
RBC / HPF: NONE SEEN RBC/HPF (ref <=2)
Squamous Epithelial / LPF: NONE SEEN [HPF] (ref <=5)
Yeast: NONE SEEN [HPF]

## 2015-04-30 LAB — VITAMIN D 25 HYDROXY (VIT D DEFICIENCY, FRACTURES): VIT D 25 HYDROXY: 25 ng/mL — AB (ref 30–100)

## 2016-03-17 ENCOUNTER — Encounter: Payer: Self-pay | Admitting: Internal Medicine

## 2016-04-09 ENCOUNTER — Ambulatory Visit (INDEPENDENT_AMBULATORY_CARE_PROVIDER_SITE_OTHER): Payer: 59 | Admitting: Internal Medicine

## 2016-04-09 ENCOUNTER — Encounter: Payer: Self-pay | Admitting: Internal Medicine

## 2016-04-09 VITALS — BP 122/90 | HR 92 | Temp 97.3°F | Resp 16 | Ht 70.0 in | Wt 138.0 lb

## 2016-04-09 DIAGNOSIS — K529 Noninfective gastroenteritis and colitis, unspecified: Secondary | ICD-10-CM

## 2016-04-09 DIAGNOSIS — A09 Infectious gastroenteritis and colitis, unspecified: Secondary | ICD-10-CM | POA: Diagnosis not present

## 2016-04-09 DIAGNOSIS — F411 Generalized anxiety disorder: Secondary | ICD-10-CM | POA: Diagnosis not present

## 2016-04-09 LAB — CBC WITH DIFFERENTIAL/PLATELET
BASOS PCT: 0 %
Basophils Absolute: 0 cells/uL (ref 0–200)
Eosinophils Absolute: 0 cells/uL — ABNORMAL LOW (ref 15–500)
Eosinophils Relative: 0 %
HEMATOCRIT: 49.8 % (ref 38.5–50.0)
Hemoglobin: 17.6 g/dL — ABNORMAL HIGH (ref 13.2–17.1)
Lymphocytes Relative: 15 %
Lymphs Abs: 1740 cells/uL (ref 850–3900)
MCH: 33.1 pg — ABNORMAL HIGH (ref 27.0–33.0)
MCHC: 35.3 g/dL (ref 32.0–36.0)
MCV: 93.8 fL (ref 80.0–100.0)
MONO ABS: 928 {cells}/uL (ref 200–950)
MONOS PCT: 8 %
MPV: 10.9 fL (ref 7.5–12.5)
Neutro Abs: 8932 cells/uL — ABNORMAL HIGH (ref 1500–7800)
Neutrophils Relative %: 77 %
Platelets: 337 10*3/uL (ref 140–400)
RBC: 5.31 MIL/uL (ref 4.20–5.80)
RDW: 13.8 % (ref 11.0–15.0)
WBC: 11.6 10*3/uL — ABNORMAL HIGH (ref 3.8–10.8)

## 2016-04-09 LAB — BASIC METABOLIC PANEL WITH GFR
BUN: 4 mg/dL — ABNORMAL LOW (ref 7–25)
CO2: 22 mmol/L (ref 20–31)
Calcium: 9.9 mg/dL (ref 8.6–10.3)
Chloride: 98 mmol/L (ref 98–110)
Creat: 0.94 mg/dL (ref 0.60–1.35)
GLUCOSE: 132 mg/dL — AB (ref 65–99)
Potassium: 3.5 mmol/L (ref 3.5–5.3)
Sodium: 137 mmol/L (ref 135–146)

## 2016-04-09 LAB — HEPATIC FUNCTION PANEL
ALK PHOS: 81 U/L (ref 40–115)
ALT: 78 U/L — ABNORMAL HIGH (ref 9–46)
AST: 168 U/L — ABNORMAL HIGH (ref 10–40)
Albumin: 4.4 g/dL (ref 3.6–5.1)
BILIRUBIN INDIRECT: 1.4 mg/dL — AB (ref 0.2–1.2)
Bilirubin, Direct: 0.4 mg/dL — ABNORMAL HIGH (ref <=0.2)
TOTAL PROTEIN: 7.2 g/dL (ref 6.1–8.1)
Total Bilirubin: 1.8 mg/dL — ABNORMAL HIGH (ref 0.2–1.2)

## 2016-04-09 MED ORDER — ONDANSETRON HCL 8 MG PO TABS: ORAL_TABLET | ORAL | Status: DC

## 2016-04-09 MED ORDER — SERTRALINE HCL 50 MG PO TABS: ORAL_TABLET | ORAL | Status: DC

## 2016-04-09 MED ORDER — CIPROFLOXACIN HCL 500 MG PO TABS
ORAL_TABLET | ORAL | Status: DC
Start: 2016-04-09 — End: 2016-04-20

## 2016-04-09 MED ORDER — METRONIDAZOLE 500 MG PO TABS: ORAL_TABLET | ORAL | Status: DC

## 2016-04-09 NOTE — Patient Instructions (Signed)
Immodium or Loperamide (anti-diarrheal) 2 mg   Take 1 or 2 tablets up to 12 tablets / 24 hours  ++++++++++++++++++++++++++++++++ Viral Gastroenteritis  Viral gastroenteritis is also known as stomach flu. This condition affects the stomach and intestinal tract. It can cause sudden diarrhea and vomiting. The illness typically lasts 3 to 8 days. Most people develop an immune response that eventually gets rid of the virus. While this natural response develops, the virus can make you quite ill. CAUSES  Many different viruses can cause gastroenteritis, such as rotavirus or noroviruses. You can catch one of these viruses by consuming contaminated food or water. You may also catch a virus by sharing utensils or other personal items with an infected person or by touching a contaminated surface. SYMPTOMS  The most common symptoms are diarrhea and vomiting. These problems can cause a severe loss of body fluids (dehydration) and a body salt (electrolyte) imbalance. Other symptoms may include:  Fever.  Headache.  Fatigue.  Abdominal pain. DIAGNOSIS  Your caregiver can usually diagnose viral gastroenteritis based on your symptoms and a physical exam. A stool sample may also be taken to test for the presence of viruses or other infections. TREATMENT  This illness typically goes away on its own. Treatments are aimed at rehydration. The most serious cases of viral gastroenteritis involve vomiting so severely that you are not able to keep fluids down. In these cases, fluids must be given through an intravenous line (IV). HOME CARE INSTRUCTIONS   Drink enough fluids to keep your urine clear or pale yellow. Drink small amounts of fluids frequently and increase the amounts as tolerated.  Ask your caregiver for specific rehydration instructions.  Avoid:  Foods high in sugar.  Alcohol.  Carbonated drinks.  Tobacco.  Juice.  Caffeine drinks.  Extremely hot or cold fluids.  Fatty, greasy  foods.  Too much intake of anything at one time.  Dairy products until 24 to 48 hours after diarrhea stops.  You may consume probiotics. Probiotics are active cultures of beneficial bacteria. They may lessen the amount and number of diarrheal stools in adults. Probiotics can be found in yogurt with active cultures and in supplements.  Wash your hands well to avoid spreading the virus.  Only take over-the-counter or prescription medicines for pain, discomfort, or fever as directed by your caregiver. Do not give aspirin to children. Antidiarrheal medicines are not recommended.  Ask your caregiver if you should continue to take your regular prescribed and over-the-counter medicines.  Keep all follow-up appointments as directed by your caregiver. SEEK IMMEDIATE MEDICAL CARE IF:   You are unable to keep fluids down.  You do not urinate at least once every 6 to 8 hours.  You develop shortness of breath.  You notice blood in your stool or vomit. This may look like coffee grounds.  You have abdominal pain that increases or is concentrated in one small area (localized).  You have persistent vomiting or diarrhea.  You have a fever.  The patient is a child younger than 3 months, and he or she has a fever.  The patient is a child older than 3 months, and he or she has a fever and persistent symptoms.  The patient is a child older than 3 months, and he or she has a fever and symptoms suddenly get worse.  The patient is a baby, and he or she has no tears when crying. MAKE SURE YOU:   Understand these instructions.  Will watch your condition.  Will get help right away if you are not doing well or get worse.   This information is not intended to replace advice given to you by your health care provider. Make sure you discuss any questions you have with your health care provider.   Document Released: 09/07/2005 Document Revised: 11/30/2011 Document Reviewed: 06/24/2011 Elsevier  Interactive Patient Education [employer].

## 2016-04-09 NOTE — Progress Notes (Signed)
  Subjective:    Patient ID: Manuel Thomas, male    DOB: [birth year], 25 y.o.   MRN: [id number]  HPI  Patient is a nice 25 yo Single WM who presents with 1 month prodrome of intermittent N/V & variable Diarrhea. Reports "no appetite" and denies fever, chills, rashes, melena, hematochezia. Describes a variety of consistencies of stools. A;lso at the end of the visit he becomes taerful repoirting his 25 yo dog just died  And then adds he had a break-up in may of a 3&/2 yr relationship and also that both hos GF & GM had died in the last year and that he has been drinking betw 1/2 to 1 pint of whiskey daily after work.  He does endorse feeling depressed , anxious, and having OCD behaviors and desires treatment. Denies SI.   No Meds   Allergies  Allergen Reactions  . Dilaudid [Hydromorphone Hcl]    Past Medical History  Diagnosis Date  . Asthma   . Prediabetes   . Vitamin D deficiency    Review of Systems 10 point systems review negative except as above.    Objective:   Physical Exam  BP 122/90 mmHg  Pulse 92  Temp(Src) 97.3 F (36.3 C)  Resp 16  Ht 5\' 10"  (1.778 m)  Wt 138 lb (62.596 kg)  BMI 19.80 kg/m2  HEENT - Eac's patent. TM's Nl. EOM's full. PERRLA. NasoOroPharynx clear. Neck - supple. Nl Thyroid. Carotids 2+ & No bruits, nodes, JVD Chest - Clear equal BS w/o Rales, rhonchi, wheezes. Cor - Nl HS. RRR w/o sig MGR. PP 1(+). No edema. Abd - No palpable organomegaly, masses or tenderness. BS nl. MS- FROM w/o deformities. Muscle power, tone and bulk Nl. Gait Nl. Neuro - No obvious Cr N abnormalities. Sensory, motor and Cerebellar functions appear Nl w/o focal abnormalities. Psyche - Mental status tearful. No ideations, delusions, SI or hallucinations.  Skin - clear w/o rash, or icterus.     Assessment & Plan:   1. Gastroenteritis presumed infectious  - Gastrointestinal Pathogen Panel PCR - ondansetron (ZOFRAN) 8 MG tablet; Take 1 tablet 3 x / day before meals for Nausea   Dispense: 30 tablet; Refill: 1 - CBC with Differential/Platelet - ciprofloxacin (CIPRO) 500 MG tablet; Take 1 tablet 2 x / day with food for infection  Dispense: 14 tablet; Refill: 0 - metroNIDAZOLE (FLAGYL) 500 MG tablet; Take 1 tablet 3 x / day for infection  Dispense: 21 tablet; Refill: 0 - BASIC METABOLIC PANEL WITH GFR - Hepatic function panel  2. Anxiety state  - sertraline (ZOLOFT) 50 MG tablet; Take 1 tablet daily - for mood  Dispense: 30 tablet; Refill: 2  ROV - 5 days to recheck.

## 2016-04-14 ENCOUNTER — Encounter: Payer: Self-pay | Admitting: Internal Medicine

## 2016-04-14 ENCOUNTER — Ambulatory Visit (INDEPENDENT_AMBULATORY_CARE_PROVIDER_SITE_OTHER): Payer: 59 | Admitting: Internal Medicine

## 2016-04-14 VITALS — BP 122/90 | HR 8 | Temp 97.6°F | Resp 16 | Ht 70.0 in | Wt 139.8 lb

## 2016-04-14 DIAGNOSIS — R5383 Other fatigue: Secondary | ICD-10-CM | POA: Diagnosis not present

## 2016-04-14 NOTE — Progress Notes (Signed)
  Subjective:    Patient ID: Manuel Thomas, male    DOB: 02/22/1991, 25 y.o.   MRN: [id number]  HPI  This nice 25 yo SWM returns for 5 day f/u after presenting w/ 1 month hx/o intermittent Nausea & Diarrhea and also related some situational or reactive depression and was started on Zoloft. Further, he related several mo hx/o drinking up to 1/2 pint-fifth of whiskey/daily and alleges minimal use over the last few days. He does relate intermittant spells of anxiety thru-out the day and also is having issues with insomnia. . Since last OV, he relates his diarrhea resolved and never completed his GI pathogen stool specimen nor did he take his prescribed Flagyl/Cipro and has improved. Marland Kitchen His CBC 7& BMET were Nl, but LFT's were slightly elevated due to alcohol. He does endorse ongoing issues with fatigue.   Medication Sig  . ondansetron (ZOFRAN) 8 MG tablet Take 1 tablet 3 x / day before meals for Nausea  . sertraline (ZOLOFT) 50 MG tablet Take 1 tablet daily - for mood   Allergies  Allergen Reactions  . Dilaudid [Hydromorphone Hcl]    Past Medical History:  Diagnosis Date  . Asthma   . Prediabetes   . Vitamin D deficiency    Review of Systems  10 point systems review negative except as above.    Objective:   Physical Exam  BP 122/90   Pulse (!) 8   Temp 97.6 F (36.4 C)   Resp 16   Ht 5\' 10"  (1.778 m)   Wt 139 lb 12.8 oz (63.4 kg)   BMI 20.06 kg/m   HEENT - Eac's patent. TM's Nl. EOM's full. PERRLA. NasoOroPharynx clear. Neck - supple. Nl Thyroid. Carotids 2+ & No bruits, nodes, JVD Chest - Clear equal BS w/o Rales, rhonchi, wheezes. Cor - Nl HS. RRR w/o sig MGR. PP 1(+). No edema. MS- FROM w/o deformities. Muscle power, tone and bulk Nl. Gait Nl. Neuro - No obvious Cr N abnormalities. Sensory, motor and Cerebellar functions appear Nl w/o focal abnormalities. Psyche - Mental status flat with forced smiles.   No delusions, ideations, SI  or obvious mood abnormalities.     Assessment & Plan:   1. Other fatigue  - Rx- Alprazolam 0.5 mg #90 - Take 1/2 -1 tablet 3 x /day.- recommended to take 1/2 tab 3 x/da - at meals and 1/2-1 tab at hs. - ROV 1 week to reassess

## 2016-04-20 ENCOUNTER — Encounter: Payer: Self-pay | Admitting: Internal Medicine

## 2016-04-20 ENCOUNTER — Ambulatory Visit (INDEPENDENT_AMBULATORY_CARE_PROVIDER_SITE_OTHER): Payer: 59 | Admitting: Internal Medicine

## 2016-04-20 VITALS — BP 122/76 | HR 92 | Temp 97.5°F | Resp 16 | Ht 70.0 in | Wt 147.8 lb

## 2016-04-20 DIAGNOSIS — R5383 Other fatigue: Secondary | ICD-10-CM | POA: Diagnosis not present

## 2016-04-20 MED ORDER — ALPRAZOLAM 0.5 MG PO TABS: ORAL_TABLET | ORAL | 0 refills | Status: DC

## 2016-04-20 NOTE — Progress Notes (Signed)
  Subjective:    Patient ID: Manuel Thomas, male    DOB: Apr 16, 1991, 25 y.o.   MRN: [id number]  HPI Ofir returns for f/u with Gi sx's of N/V & diarrhea resolved. He's now ion Zoloft x 10-11 days and using the xanax and reports any rare digressions to drink liquor and feels the xanax is helping with the cravings. Denies any tremor or audio-visual perceptions.   Medication Sig  . ondansetron (ZOFRAN) 8 MG tablet Take 1 tablet 3 x / day before meals for Nausea   Alprazolam 0.5 mg  Takes 1 tablet 3 x/ day   . sertraline (ZOLOFT) 50 MG tablet Take 1 tablet daily - for mood   Allergies  Allergen Reactions  . Dilaudid [Hydromorphone Hcl]    Past Medical History:  Diagnosis Date  . Asthma   . Prediabetes   . Vitamin D deficiency    Review of Systems  10 point systems review negative except as above.    Objective:   Physical Exam  BP 122/76   Pulse 92   Temp 97.5 F (36.4 C)   Resp 16   Ht 5\' 10"  (1.778 m)   Wt 147 lb 12.8 oz (67 kg)   BMI 21.21 kg/m    Appears well groomed & in no distress.  HEENT - Eac's patent. TM's Nl. EOM's full. PERRLA. NasoOroPharynx clear. Neck - supple. Nl Thyroid. Carotids 2+ & No bruits, nodes, JVD Chest - Clear equal BS w/o Rales, rhonchi, wheezes. Cor - Nl HS. RRR w/o sig MGR. PP 1(+). No edema. MS- FROM w/o deformities. Muscle power, tone and bulk Nl. Gait Nl. Neuro - No obvious Cr N abnormalities. Sensory, motor and Cerebellar functions appear Nl w/o focal abnormalities. Psyche - Mental status normal & appropriate.  No delusions, ideations or obvious mood abnormalities.    Assessment & Plan:   1. Other fatigue  - continue Zoloft 50 mg  - Rx Alprazolam 0.5 mg   (#135- 0 Rf) to take 1 to 1&1/2 tabs 3 x/da and discussed taper as continues  Zoloft   - encouraged to call Dr Lyanne Co for counseling.

## 2016-04-20 NOTE — Patient Instructions (Addendum)
Alcohol Use Disorder  Alcohol use disorder is a mental disorder. It is not a one-time incident of heavy drinking. Alcohol use disorder is the excessive and uncontrollable use of alcohol over time that leads to problems with functioning in one or more areas of daily living. People with this disorder risk harming themselves and others when they drink to excess. Alcohol use disorder also can cause other mental disorders, such as mood and anxiety disorders, and serious physical problems. People with alcohol use disorder often misuse other drugs.  Alcohol use disorder is common and widespread. Some people with this disorder drink alcohol to cope with or escape from negative life events. Others drink to relieve chronic pain or symptoms of mental illness. People with a family history of alcohol use disorder are at higher risk of losing control and using alcohol to excess.  Drinking too much alcohol can cause injury, accidents, and health problems. One drink can be too much when you are:  Working.  Pregnant or breastfeeding.  Taking medicines. Ask your doctor.  Driving or planning to drive. SYMPTOMS  Signs and symptoms of alcohol use disorder may include the following:   Consumption ofalcohol inlarger amounts or over a longer period of time than intended.  Multiple unsuccessful attempts to cutdown or control alcohol use.   A great deal of time spent obtaining alcohol, using alcohol, or recovering from the effects of alcohol (hangover).  A strong desire or urge to use alcohol (cravings).   Continued use of alcohol despite problems at work, school, or home because of alcohol use.   Continued use of alcohol despite problems in relationships because of alcohol use.  Continued use of alcohol in situations when it is physically hazardous, such as driving a car.  Continued use of alcohol despite awareness of a physical or psychological problem that is likely related to alcohol use. Physical  problems related to alcohol use can involve the brain, heart, liver, stomach, and intestines. Psychological problems related to alcohol use include intoxication, depression, anxiety, psychosis, delirium, and dementia.   The need for increased amounts of alcohol to achieve the same desired effect, or a decreased effect from the consumption of the same amount of alcohol (tolerance).  Withdrawal symptoms upon reducing or stopping alcohol use, or alcohol use to reduce or avoid withdrawal symptoms. Withdrawal symptoms include:  Racing heart.  Hand tremor.  Difficulty sleeping.  Nausea.  Vomiting.  Hallucinations.  Restlessness.  Seizures. DIAGNOSIS Alcohol use disorder is diagnosed through an assessment by your health care provider. Your health care provider may start by asking three or four questions to screen for excessive or problematic alcohol use. To confirm a diagnosis of alcohol use disorder, at least two symptoms must be present within a 66-month period. The severity of alcohol use disorder depends on the number of symptoms:  Mild--two or three.  Moderate--four or five.  Severe--six or more. Your health care provider may perform a physical exam or use results from lab tests to see if you have physical problems resulting from alcohol use. Your health care provider may refer you to a mental health professional for evaluation. TREATMENT  Some people with alcohol use disorder are able to reduce their alcohol use to low-risk levels. Some people with alcohol use disorder need to quit drinking alcohol. When necessary, mental health professionals with specialized training in substance use treatment can help. Your health care provider can help you decide how severe your alcohol use disorder is and what type of treatment you  need. The following forms of treatment are available:   Detoxification. Detoxification involves the use of prescription medicines to prevent alcohol withdrawal  symptoms in the first week after quitting. This is important for people with a history of symptoms of withdrawal and for heavy drinkers who are likely to have withdrawal symptoms. Alcohol withdrawal can be dangerous and, in severe cases, cause death. Detoxification is usually provided in a hospital or in-patient substance use treatment facility.  Counseling or talk therapy. Talk therapy is provided by substance use treatment counselors. It addresses the reasons people use alcohol and ways to keep them from drinking again. The goals of talk therapy are to help people with alcohol use disorder find healthy activities and ways to cope with life stress, to identify and avoid triggers for alcohol use, and to handle cravings, which can cause relapse.  Medicines.Different medicines can help treat alcohol use disorder through the following actions:  Decrease alcohol cravings.  Decrease the positive reward response felt from alcohol use.  Produce an uncomfortable physical reaction when alcohol is used (aversion therapy).  Support groups. Support groups are run by people who have quit drinking. They provide emotional support, advice, and guidance. These forms of treatment are often combined. Some people with alcohol use disorder benefit from intensive combination treatment provided by specialized substance use treatment centers. Both inpatient and outpatient treatment programs are available.

## 2016-05-11 ENCOUNTER — Encounter: Payer: Self-pay | Admitting: Internal Medicine

## 2016-05-11 ENCOUNTER — Ambulatory Visit (INDEPENDENT_AMBULATORY_CARE_PROVIDER_SITE_OTHER): Payer: 59 | Admitting: Internal Medicine

## 2016-05-11 VITALS — BP 124/76 | HR 72 | Temp 97.3°F | Ht 70.0 in | Wt 153.2 lb

## 2016-05-11 DIAGNOSIS — R5383 Other fatigue: Secondary | ICD-10-CM

## 2016-05-11 DIAGNOSIS — F411 Generalized anxiety disorder: Secondary | ICD-10-CM

## 2016-05-11 MED ORDER — SERTRALINE HCL 100 MG PO TABS: ORAL_TABLET | ORAL | 1 refills | Status: DC

## 2016-05-11 NOTE — Progress Notes (Signed)
  Subjective:    Patient ID: Manuel Thomas, male    DOB: 06-16-1991, 25 y.o.   MRN: [id number]  HPI  This very nice 25 yo SWM employed at a country [employer] was recently started on Zoloft 50 mg and had Xanax low dose added to facilitate his transition from excess alcohol consumption & he seems to be doing fairy well alleging no alcohol since 3 week ago OV. Denies any tremors/shakes, hallucinations or strong cravings. Has had issues with fatigue, but general energy level seems also to be improving with restorative sleep since stopping consuming alcohol.   Medication Sig  . ALPRAZolam (XANAX) 0.5 MG tablet Take 1 to 1&1/2 tablets 3 x day for Anxiety  . ondansetron (ZOFRAN) 8 MG tablet Take 1 tablet 3 x / day before meals for Nausea  . sertraline (ZOLOFT) 50 MG tablet Take 1 tablet daily - for mood   Allergies  Allergen Reactions  . Dilaudid [Hydromorphone Hcl]    Past Medical History:  Diagnosis Date  . Asthma   . Prediabetes   . Vitamin D deficiency    Review of Systems  10 point systems review negative except as above.     Objective:   Physical Exam  BP 124/76   Pulse 72   Temp 97.3 F (36.3 C)   Ht 5\' 10"  (1.778 m)   Wt 153 lb 3.2 oz (69.5 kg)   BMI 21.98 kg/m   HEENT -WNL Neck - supple. Nl Thyroid. No bruits, nodes. Chest - Clear equal BS. Cor - Nl HS. RRR w/o sig M. No edema. MS- FROM w/o deformities. Gait Nl. Neuro - No obvious Cr N abnormalities.Nl w/o focal abnormalities. Psyche - Mental status normal & appropriate.  No delusions, ideations or obvious mood abnormalities.    Assessment & Plan:   1. Other fatigue   2. Anxiety state  - sertraline (ZOLOFT) 100 MG tablet; Take 1 tablet daily - for mood  Dispense: 90 tablet; Refill: 1  - discussed slow titration off Alprazolam as have increased dose of Sertraline.  - Discussed meds & SE's  - ROV - 1 month

## 2016-05-18 ENCOUNTER — Encounter: Payer: Self-pay | Admitting: Internal Medicine

## 2016-06-08 ENCOUNTER — Other Ambulatory Visit: Payer: Self-pay | Admitting: Internal Medicine

## 2016-06-22 ENCOUNTER — Ambulatory Visit: Payer: Self-pay | Admitting: Internal Medicine

## 2016-06-23 ENCOUNTER — Encounter: Payer: Self-pay | Admitting: Internal Medicine

## 2016-06-23 ENCOUNTER — Ambulatory Visit: Payer: Self-pay | Admitting: Internal Medicine

## 2016-06-23 ENCOUNTER — Other Ambulatory Visit: Payer: Self-pay | Admitting: Internal Medicine

## 2016-06-23 DIAGNOSIS — R0989 Other specified symptoms and signs involving the circulatory and respiratory systems: Secondary | ICD-10-CM | POA: Insufficient documentation

## 2016-06-23 DIAGNOSIS — E782 Mixed hyperlipidemia: Secondary | ICD-10-CM | POA: Insufficient documentation

## 2016-06-23 NOTE — Patient Instructions (Signed)

## 2016-06-23 NOTE — Progress Notes (Signed)
RESCHEDULED

## 2016-06-29 ENCOUNTER — Ambulatory Visit: Payer: Self-pay | Admitting: Internal Medicine

## 2016-07-20 ENCOUNTER — Other Ambulatory Visit: Payer: Self-pay | Admitting: Internal Medicine

## 2016-07-27 ENCOUNTER — Encounter (HOSPITAL_COMMUNITY): Payer: Self-pay | Admitting: Psychology

## 2016-07-27 ENCOUNTER — Ambulatory Visit (INDEPENDENT_AMBULATORY_CARE_PROVIDER_SITE_OTHER): Payer: 59 | Admitting: Psychology

## 2016-07-27 ENCOUNTER — Other Ambulatory Visit (HOSPITAL_COMMUNITY): Payer: 59

## 2016-07-27 DIAGNOSIS — F1020 Alcohol dependence, uncomplicated: Secondary | ICD-10-CM | POA: Diagnosis not present

## 2016-07-27 DIAGNOSIS — F1994 Other psychoactive substance use, unspecified with psychoactive substance-induced mood disorder: Secondary | ICD-10-CM

## 2016-07-27 DIAGNOSIS — F10280 Alcohol dependence with alcohol-induced anxiety disorder: Secondary | ICD-10-CM | POA: Diagnosis not present

## 2016-07-27 DIAGNOSIS — F1220 Cannabis dependence, uncomplicated: Secondary | ICD-10-CM | POA: Insufficient documentation

## 2016-07-27 NOTE — Progress Notes (Signed)
Comprehensive Clinical Assessment (CCA) Note  07/27/2016 Manuel Thomas [id number]  Visit Diagnosis:      ICD-9-CM ICD-10-CM   1. Alcohol use disorder, severe, dependence (HCC) 303.90 F10.20   2. Substance induced mood disorder (HCC) 292.84 F19.94   3. 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 He reports he also began taking 5mg  Xanax daily a few months ago, as px for anxiety and help with sleep. Patient reports drinking 750 ml whiskey near daily. Patient reports smoking 1-2 oz marijuana daily. He states having suffered from obsessions and worry about "Myna Hidalgo and doing behaviors multiple times to ease worry and gain peace". Patient is seeking entry into CD-IOP and admits he has a current problem with alcohol dependence and family hx of alcoholism. Patient states he has a long hx of drinking and smoking marijuana to ease anxiety on a daily basis. Patient reports his grandfather on his mother's side, with whom he was very close, died 1 year ago. He also recently broke up with his girlfriend of 3 years. Patient states gf verbalized her concern about his problematic drinking after witnessing patient shaking one morning before drinking. Patient admits his drinking contributed to his breakup. Patient states he has taken opiates in the past, only as px after surgeries or medical reasons, and ended usage when px ended. Patient was lucid, engaged, and active during session. He spoke openly and clearly answered all questions that were asked of him. Patient states that he has "always lived with some worry or anxiety that something bad will happen" and the alcohol helped him forget about that worry. Marijuana helped him "sleep and relax". 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 Patient states he loves his job and has been interested in Librarian, academic since [employer]. Patient graduated HS in [employer]. He reports a "very positive, supportive childhood", no trauma, abuse, neglect of any kind. He denies current suicidal ideation but admits he has had vague thoughts of being "better off dead" in his past. Denies current and hx of homicidal intent. His reported strengths are family closeness, family support, good insight into disease of alcoholism, creative, good Visual merchandiser, Culinary Degree awarded, and friends. Patient stated he wants to abstain from all mind-altering chemicals while in program and return to "baseline" to assess his anxiety and find alternative coping strategies. Patient states he is restless at night and exhibits alcohol withdrawal symptoms of sweats, shakes, vomiting, loss of appetite for past few days. He states he is feeling better today and that he last used 1.5mg  Xanax this morning. Patient's goals include build social support for recovery life, attend 12 step meetings 4 times per week, and abstain from all mood altering chemicals. Patient signed all necessary paperwork and discussed program agreement, patient rights, and group rules. Patient completed PHQ 9, CAGE, AUDIT, and GAD7 screenings (attached in EPIC). Counselor showed patient group rooms, restrooms, and patient reports he will return for CD-IOP group Wednesday, 07/29/16. Patient reports he already attended 2 AA meetings and "really enjoyed them and picked up a starter chip". Patient appears to suffer from severe and debilitating anxiety, reporting he often spends "hours worrying about thing outside of his control" and engaging in ritualistic behaviors such as counting his steps and "  doing things in 3's". It appears that alcohol and drugs serve a "self-medicating purpose" and given his family hx of alcoholism, he has reached the point of dependency. Patient last used today.  CCA Part  One  Part One has been completed on paper by the patient.  (See scanned document in Chart Review)  CCA Part Two A  Intake/Chief Complaint:  CCA Intake With Chief Complaint CCA Part Two Date: 07/27/16 CCA Part Two Time: 0921 Chief Complaint/Presenting Problem: Alcoholism, dependency developed 2016 after relationship ended and grandparents died, family hx of alcoholism Patients Currently Reported Symptoms/Problems: alcohol withdrawl, reduced performance at work, reduced passion for work, isolation from intoxication, avoiding family,  Individual's Strengths: creative, HS graduate, Product managerculinary skills, family support, connection to family, likes to learn Type of Services Patient Feels Are Needed: CD-IOP Initial Clinical Notes/Concerns: reliance on benzos and marijuana for stress, sleep  Mental Health Symptoms Depression:   Tired, loss of interest/pleasure  Mania:     Anxiety:   Anxiety: Difficulty concentrating, Restlessness, Tension, Worrying  Psychosis:     Trauma:     Obsessions:  Obsessions: Cause anxiety, Disrupts routine/functioning, Intrusive/time consuming, Recurrent & persistent thoughts/impulses/images, Attempts to suppress/neutralize, Good insight  Compulsions:  Compulsions: "Driven" to perform behaviors/acts, Good insight, Disrupts with routine/functioning, Absent insight/delusional, Intended to reduce stress or prevent another outcome, Intrusive/time consuming, Repeated behaviors/mental acts  Inattention:     Hyperactivity/Impulsivity:     Oppositional/Defiant Behaviors:     Borderline Personality:     Other Mood/Personality Symptoms:  Other Mood/Personality Symtpoms: "fatigue, listless w/o alcohol or xanax"   Mental Status Exam Appearance and self-care  Stature:  Stature: Average  Weight:  Weight: Average weight  Clothing:  Clothing: Meticulous, Neat/clean  Grooming:  Grooming: Well-groomed  Cosmetic use:  Cosmetic Use: None  Posture/gait:  Posture/Gait: Rigid  Motor  activity:  Motor Activity: Not Remarkable  Sensorium  Attention:  Attention: Normal  Concentration:  Concentration: Normal  Orientation:  Orientation: X5  Recall/memory:  Recall/Memory: Normal  Affect and Mood  Affect:  Affect: Blunted, Flat  Mood:     Relating  Eye contact:  Eye Contact: Normal  Facial expression:  Facial Expression: Responsive, Tense  Attitude toward examiner:  Attitude Toward Examiner: Cooperative  Thought and Language  Speech flow: Speech Flow: Normal  Thought content:  Thought Content: Appropriate to mood and circumstances  Preoccupation:  Preoccupations: Obsessions, Guilt, Ruminations  Hallucinations:     Organization:     Company secretaryxecutive Functions  Fund of Knowledge:  Fund of Knowledge: Average  Intelligence:  Intelligence: Average  Abstraction:  Abstraction: CuratorConcrete, Normal  Judgement:  Judgement: Normal, Common-sensical  Reality Testing:  Reality Testing: Adequate  Insight:  Insight: Flashes of insight, Good  Decision Making:  Decision Making: Normal  Social Functioning  Social Maturity:  Social Maturity: Isolates  Social Judgement:  Social Judgement: Normal  Stress  Stressors:  Stressors: Work, Arts administratorMoney (worry about bills, not financially difficult but stressful)  Coping Ability:  Coping Ability: Exhausted, Building surveyorverwhelmed  Skill Deficits:     Supports:      Family and Psychosocial History: Family history Marital status: Single Are you sexually active?: No What is your sexual orientation?: heterosexual Has your sexual activity been affected by drugs, alcohol, medication, or emotional stress?: none Does patient have children?: No  Childhood History:  Childhood History By whom was/is the patient raised?: Both parents Additional childhood history information: "great childhood, went to church every Sunday, parents supportive" Description of patient's relationship with caregiver when they  were a child: Great Patient's description of current relationship with  people who raised him/her: great How were you disciplined when you got in trouble as a child/adolescent?: spanking till 89yo Does patient have siblings?: Yes Number of Siblings: 1 Description of patient's current relationship with siblings: distant, not strained Did patient suffer any verbal/emotional/physical/sexual abuse as a child?: No Did patient suffer from severe childhood neglect?: No Has patient ever been sexually abused/assaulted/raped as an adolescent or adult?: No Was the patient ever a victim of a crime or a disaster?: No Witnessed domestic violence?: No Has patient been effected by domestic violence as an adult?: No  CCA Part Two B  Employment/Work Situation: Employment / Work Psychologist, occupationalituation Employment situation: Leave of absence Where is patient currently employed?: [employer], 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 Patient's job has been impacted by current illness: Yes Describe how patient's job has been impacted: reduced performance, passion What is the longest time patient has a held a job?: 3 years Where was the patient employed at that time?: [employer] Has patient ever been in the Eli Lilly and Companymilitary?: No Has patient ever served in combat?: No Did You Receive Any Psychiatric Treatment/Services While in Equities traderthe Military?: No Are There Guns or Other Weapons in Your Home?: No  Education: Education Last Grade Completed: 2 [employer] of [employer] Did Garment/textile technologistYou Graduate From [employer]?: Yes Did Theme park managerYou Attend College?: Yes What Type of College Degree Do you Have?: Culinary degree Did You Have Any Special Interests In School?: Culinary  Religion: Religion/Spirituality Are You A Religious Person?: No How Might This Affect Treatment?: none  Leisure/Recreation: Leisure / Recreation Leisure and Hobbies: bicycle, [employer], basketball, drumkit, ping pong,   Exercise/Diet: Exercise/Diet Do You Exercise?: Yes What Type of Exercise Do You Do?:  Bike, Weight Training How Many Times a Week Do You Exercise?:  (3-4 times week, ended 6 mo ago) Have You Gained or Lost A Significant Amount of Weight in the Past Six Months?: Yes-Gained Number of Pounds Gained: 10 Do You Have Any Trouble Sleeping?: Yes Explanation of Sleeping Difficulties: restlessness, worry  CCA Part Two C  Alcohol/Drug Use: Alcohol / Drug Use Prescriptions: oxycontin, Hydrocodone, Xanax History of alcohol / drug use?: Yes Longest period of sobriety (when/how long): 24 hours Negative Consequences of Use: Financial, Personal relationships, Work / School Substance #1 Name of Substance 1: alcohol-whiskey 1 - Age of First Use: 14 1 - Amount (size/oz): 750ml 1 - Frequency: daily 1 - Duration: 1 year 1 - Last Use / Amount: 07/24/16 Substance #2 Name of Substance 2: marijuana 2 - Age of First Use: 14 2 - Amount (size/oz): 1 ounce 2 - Frequency: daily 2 - Duration: 6 years 2 - Last Use / Amount: 07/25/16 Substance #3 Name of Substance 3: Benzodiazapine- Xanax 3 - Age of First Use: 24 3 - Amount (size/oz): 5mg  3 - Frequency: daily 3 - Duration: 2 mo 3 - Last Use / Amount: 07/27/16 Substance #4 Name of Substance 4: Opiate- Oxycontin 4 - Age of First Use: 16 4 - Amount (size/oz): as px 4 - Frequency: as px 4 - Duration: 1 month 4 - Last Use / Amount: 2 years ago       CCA Part Three  ASAM's:  Six Dimensions of Multidimensional Assessment  Dimension 1:  Acute Intoxication and/or Withdrawal Potential:     Dimension 2:  Biomedical Conditions and Complications:     Dimension 3:  Emotional, Behavioral, or Cognitive Conditions  and Complications:     Dimension 4:  Readiness to Change:     Dimension 5:  Relapse, Continued use, or Continued Problem Potential:     Dimension 6:  Recovery/Living Environment:      Substance use Disorder (SUD) Substance Use Disorder (SUD)  Checklist Symptoms of Substance Use: Continued use despite having a persistent/recurrent  physical/psychological problem caused/exacerbated by use, Continued use despite persistent or recurrent social, interpersonal problems, caused or exacerbated by use, Evidence of tolerance, Evidence of withdrawal (Comment), Large amounts of time spent to obtain, use or recover from the substance(s), Persistent desire or unsuccessful efforts to cut down or control use, Presence of craving or strong urge to use, Recurrent use that results in a fialure to fulfill major rule obligatinos (work, school, home), Repeated use in physically hazardous situations, Social, occupational, recreational activities given up or reduced due to use, Substance(s) often taken in large amounts or over longer times than was intended  Social Function:  Social Functioning Social Maturity: Isolates Social Judgement: Normal  Stress:  Stress Stressors: Work, Arts administrator (worry about bills, not financially difficult but stressful) Coping Ability: Exhausted, Overwhelmed Patient Takes Medications The Way The Doctor Instructed?: Yes Priority Risk: High Risk  Risk Assessment- Self-Harm Potential: Risk Assessment For Self-Harm Potential Thoughts of Self-Harm: No current thoughts Method: No plan Availability of Means: No access/NA  Risk Assessment -Dangerous to Others Potential: Risk Assessment For Dangerous to Others Potential Method: No Plan Availability of Means: No access or NA Notification Required: No need or identified person  DSM5 Diagnoses: Patient Active Problem List   Diagnosis Date Noted  . 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 Mixed hyperlipidemia 06/23/2016  . Asthma   . Prediabetes   . Vitamin D deficiency     Patient Centered Plan: Patient is on the following Treatment Plan(s):  Chemical Dependency, Substance-Induced Mood disorder  Recommendations for Services/Supports/Treatments: Recommendations for Services/Supports/Treatments Recommendations For Services/Supports/Treatments: CD-IOP Intensive Chemical  Dependency Program  Treatment Plan Summary:    Referrals to Alternative Service(s): Referred to Alternative Service(s):   Place:   Date:   Time:    Referred to Alternative Service(s):   Place:   Date:   Time:    Referred to Alternative Service(s):   Place:   Date:   Time:    Referred to Alternative Service(s):   Place:   Date:   Time:     Charmian Muff

## 2016-07-29 ENCOUNTER — Other Ambulatory Visit (HOSPITAL_COMMUNITY): Payer: 59 | Attending: Medical | Admitting: Psychology

## 2016-07-29 ENCOUNTER — Encounter (HOSPITAL_COMMUNITY): Payer: Self-pay | Admitting: Medical

## 2016-07-29 VITALS — BP 128/76 | HR 75 | Ht 70.5 in | Wt 148.6 lb

## 2016-07-29 DIAGNOSIS — Z833 Family history of diabetes mellitus: Secondary | ICD-10-CM | POA: Insufficient documentation

## 2016-07-29 DIAGNOSIS — F17210 Nicotine dependence, cigarettes, uncomplicated: Secondary | ICD-10-CM | POA: Insufficient documentation

## 2016-07-29 DIAGNOSIS — Z9889 Other specified postprocedural states: Secondary | ICD-10-CM | POA: Insufficient documentation

## 2016-07-29 DIAGNOSIS — R7303 Prediabetes: Secondary | ICD-10-CM | POA: Insufficient documentation

## 2016-07-29 DIAGNOSIS — F1994 Other psychoactive substance use, unspecified with psychoactive substance-induced mood disorder: Secondary | ICD-10-CM | POA: Insufficient documentation

## 2016-07-29 DIAGNOSIS — J45909 Unspecified asthma, uncomplicated: Secondary | ICD-10-CM | POA: Insufficient documentation

## 2016-07-29 DIAGNOSIS — I10 Essential (primary) hypertension: Secondary | ICD-10-CM | POA: Insufficient documentation

## 2016-07-29 DIAGNOSIS — Z79899 Other long term (current) drug therapy: Secondary | ICD-10-CM | POA: Diagnosis not present

## 2016-07-29 DIAGNOSIS — F411 Generalized anxiety disorder: Secondary | ICD-10-CM

## 2016-07-29 DIAGNOSIS — K7010 Alcoholic hepatitis without ascites: Secondary | ICD-10-CM

## 2016-07-29 DIAGNOSIS — Z888 Allergy status to other drugs, medicaments and biological substances status: Secondary | ICD-10-CM | POA: Diagnosis not present

## 2016-07-29 DIAGNOSIS — Z8249 Family history of ischemic heart disease and other diseases of the circulatory system: Secondary | ICD-10-CM | POA: Diagnosis not present

## 2016-07-29 DIAGNOSIS — Z8261 Family history of arthritis: Secondary | ICD-10-CM | POA: Insufficient documentation

## 2016-07-29 DIAGNOSIS — Z6372 Alcoholism and drug addiction in family: Secondary | ICD-10-CM | POA: Diagnosis not present

## 2016-07-29 DIAGNOSIS — F1020 Alcohol dependence, uncomplicated: Secondary | ICD-10-CM

## 2016-07-29 DIAGNOSIS — E782 Mixed hyperlipidemia: Secondary | ICD-10-CM | POA: Diagnosis not present

## 2016-07-29 DIAGNOSIS — F1220 Cannabis dependence, uncomplicated: Secondary | ICD-10-CM | POA: Diagnosis not present

## 2016-07-29 DIAGNOSIS — Z823 Family history of stroke: Secondary | ICD-10-CM | POA: Diagnosis not present

## 2016-07-29 DIAGNOSIS — R0989 Other specified symptoms and signs involving the circulatory and respiratory systems: Secondary | ICD-10-CM

## 2016-07-29 DIAGNOSIS — F12229 Cannabis dependence with intoxication, unspecified: Secondary | ICD-10-CM | POA: Insufficient documentation

## 2016-07-29 DIAGNOSIS — F10280 Alcohol dependence with alcohol-induced anxiety disorder: Secondary | ICD-10-CM | POA: Insufficient documentation

## 2016-07-29 DIAGNOSIS — Z811 Family history of alcohol abuse and dependence: Secondary | ICD-10-CM

## 2016-07-29 HISTORY — DX: Alcoholic hepatitis without ascites: K70.10

## 2016-07-29 MED ORDER — ACAMPROSATE CALCIUM 333 MG PO TBEC: 666.0000 mg | DELAYED_RELEASE_TABLET | Freq: Three times a day (TID) | ORAL | 0 refills | Status: DC

## 2016-07-29 MED ORDER — NALTREXONE HCL 50 MG PO TABS: 50.0000 mg | ORAL_TABLET | Freq: Every day | ORAL | 0 refills | Status: DC

## 2016-07-29 NOTE — Progress Notes (Signed)
Psychiatric Initial Adult Assessment   Patient Identification: Manuel Thomas MRN:  [id number] Date of Evaluation:  07/29/2016 Referral Source: [place]m McKeown MD Chief Complaint:   Chief Complaint    Establish Care; Addiction Problem; Anxiety; Substance induced mood disorder; Family Problem; Hypertension; Hyperlipidemia; ASlcoholic hepatitis     Visit Diagnosis:    ICD-9-CM ICD-10-CM   1. Alcohol use disorder, severe, dependence (HCC) 303.90 F10.20   2. Cannabis use disorder, severe, dependence (HCC) 304.30 F12.20   3. Substance induced mood disorder (HCC) 292.84 F19.94   4. 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 Prediabetes 790.29 R73.03   6. Labile hypertension 401.9 I10   7. Mixed hyperlipidemia 272.2 E78.2   8. Acute alcoholic hepatitis 571.1 K70.10   9. Family history of alcoholism in paternal grandfather V61.41 28Z63.72   2210. Family history of alcoholism in father V61.41 14Z63.72    Subjective" I was going down the wrong path-I was killing myself with alcohol.It was affecting my work,myrelationships,my family. 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 Alcohol use disorder, severe, dependence (HCC) 303.90 F10.20   2. Substance induced mood disorder (HCC) 292.84 F19.94   3. 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 He reports he also began taking  5mg  Xanax daily a few months ago, as px for anxiety and help with sleep. Patient reports drinking 750 ml whiskey near daily. Patient reports smoking 1-2 oz marijuana daily. He states having suffered from obsessions and worry about "Myna HidalgoKarma and doing behaviors multiple times to ease worry and gain peace". Patient is seeking entry into CD-IOP and admits he has a current problem with alcohol dependence and family hx of alcoholism. Patient states he has a long hx of drinking and smoking marijuana to ease anxiety on a daily basis. Patient reports his grandfather on his mother's side, with whom he was very close, died 1 year ago. He also recently broke up with his girlfriend of 3 years. Patient states gf verbalized her concern about his problematic drinking after witnessing patient shaking one morning before drinking. Patient admits his drinking contributed to his breakup. Patient states he has taken opiates in the past, only as px after surgeries or medical reasons, and ended usage when px ended. Patient was lucid, engaged, and active during session. He spoke openly and clearly answered all questions that were asked of him. Patient states that he has "always lived with some worry or anxiety that something bad will happen" and the alcohol helped him forget about that worry. Marijuana helped him "sleep and relax". 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 Patient states he loves his job and has been interested in Librarian, academiccooking professionally since [employer]. [place] He reports a "very  positive, supportive childhood", no trauma, abuse, neglect of any kind. He denies current suicidal ideation but admits he has had vague thoughts of being "better off dead" in his past. Denies current and hx of homicidal intent. His reported strengths are family closeness, family support, good insight into disease of alcoholism, creative, good Visual merchandisercommunicator, Culinary Degree  awarded, and friends. Patient stated he wants to abstain from all mind-altering chemicals while in program and return to "baseline" to assess his anxiety and find alternative coping strategies. Patient states he is restless at night and exhibits alcohol withdrawal symptoms of sweats, shakes, vomiting, loss of appetite for past few days. He states he is feeling better today and that he last used 1.5mg  Xanax this morning. Patient's goals include build social support for recovery life, attend 12 step meetings 4 times per week, and abstain from all mood altering chemicals. Patient signed all necessary paperwork and discussed program agreement, patient rights, and group rules. Patient completed PHQ 9, CAGE, AUDIT, and GAD7 screenings (attached in EPIC). Counselor showed patient group rooms, restrooms, and patient reports he will return for CD-IOP group Wednesday, 07/29/16. Patient reports he already attended 2 AA meetings and "really enjoyed them and picked up a starter chip". Patient appears to suffer from severe and debilitating anxiety, reporting he often spends "hours worrying about thing outside of his control" and engaging in ritualistic behaviors such as counting his steps and "doing things in 3's". It appears that alcohol and drugs serve a "self-medicating purpose" and given his family hx of alcoholism, he has reached the point of dependency. Patient last used today.        Today her reports 4th day off alcohol and 1 day off Pot with improving withdrawal symptoms of only occasional diarrhea,slight HAs and mild tremors.He admits to significant cravings and wishes to try MAT with Naltrexone and Campral. He is currently taking Zoloft for his anxiety with "some" response.He has learned that drinking does not help his breakup . He is glad to be in treatment.  Associated Signs/Symptoms: Cage 4/4+;Audit  Score 34 Consumption 8/12; Dependence 12/12 Social + Almost certainly alcoholic DSM V criteria 11/11 + SUD  Severe Dependence Alcohol and Cannabis  Depression Symptoms:  depressed mood, anhedonia, insomnia, psychomotor retardation, fatigue, feelings of worthlessness/guilt, difficulty concentrating, impaired memory, recurrent thoughts of death, weight loss, decreased appetite, (Hypo) Manic Symptoms:  Impulsivity, Irritable Mood, Labiality of Mood, Anxiety Symptoms:  Excessive Worry,GAD 7 score 18 Psychotic Symptoms:  None PTSD Symptoms: Negative  Past Psychiatric History:NA  Previous Psychotropic Medications: Yes Xanax and Zoloft from PCP  Substance Abuse History in the last 12 months:  Yes.   Substance Abuse History in the last 12 months: Substance Age of 1st Use Last Use Amount Specific Type  Nicotine 14 today PPD Cigs  Alcohol 14 07/24/2016 1/5 Liqour  Cannabis 14 07/25/2016 3.5 gms Pot/smokes  Opiates RX ONLY     Cocaine      Methamphetamines      LSD      Ecstasy      Benzodiazepines 24 07/28/2017 1mg  Xanax  Caffeine      Inhalants      Others:                          Consequences of Substance Abuse: Medical Consequences:  Elevated liver enzymes;Withdrawal Legal Consequences:  None yet Family Consequences:  Lost relationship of 3 years/caused pain to parents and strained family relationships Blackouts:  Denies but  reports short term memory losses DT's: No Withdrawal Symptoms:    Cramps Diaphoresis Diarrhea Headaches Nausea Tremors Vomiting Headaches  Past Medical History:  Past Medical History:  Diagnosis Date  . Asthma   . Prediabetes   . Vitamin D deficiency     Past Surgical History:  Procedure Laterality Date  . ADENOIDECTOMY    . CLOSED REDUCTION FOREARM FRACTURE Left 2005  . INGUINAL HERNIA REPAIR  2003  . TONSILLECTOMY      Family Psychiatric History:Alcoholism in Father and PGF Father sober since 59   Family History:  Family History  Problem Relation Age of Onset  . Heart disease Mother   . Heart attack Other   . Arthritis  Other   . Hypertension Other   . Diabetes Other   . Stroke Other     Social History:   Social History   Social History  . Marital status: Single    Spouse name: N/A  . Number of children: N/A  . Years of education: Associates Degree in [employer]   Social History Main Topics  . Smoking status: Current Every Day Smoker    Types: Cigarettes  . Smokeless tobacco: Never Used  . Alcohol use Yes  . Drug use: No  . Sexual activity: Not on file   Other Topics Concern  . See CCA   Social History Narrative  . Marital status: Single Are you sexually active?: No What is your sexual orientation?: heterosexual Has your sexual activity been affected by drugs, alcohol, medication, or emotional stress?: none Does patient have children?: No Marital status: Single Are you sexually active?: No What is your sexual orientation?: heterosexual Has your sexual activity been affected by drugs, alcohol, medication, or emotional stress?: none Does patient have children?: No Childhood History:  Childhood History By whom was/is the patient raised?: Both parents Additional childhood history information: "great childhood, went to church every Sunday, parents supportive" Description of patient's relationship with caregiver when they were a child: Great Patient's description of current relationship with people who raised him/her: great How were you disciplined when you got in trouble as a child/adolescent?: spanking till 61yo Does patient have siblings?: Yes Number of Siblings: 1 Description of patient's current relationship with siblings: distant, not strained Did patient suffer any verbal/emotional/physical/sexual abuse as a child?: No Did patient suffer from severe childhood neglect?: No Has patient ever been sexually abused/assaulted/raped as an adolescent or adult?: No Was the patient ever a victim of a crime or a disaster?: No Witnessed domestic violence?: No Has patient been effected by  domestic violence as an adult?: No Employment/Work Situation: Employment / Work Psychologist, 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[employer], 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 Patient's job has been impacted by current illness: Yes Describe how patient's job has been impacted: reduced performance, passion What is the longest time patient has a held a job?: 3 years Where was the patient employed at that time?: [employer] Has patient ever been in the [employer]?: No Has patient ever served in combat?: No Did You Receive Any Psychiatric Treatment/Services While in Equities trader?: No Are There Guns or Other Weapons in Your Home?: No Employment/Work Situation: Employment / Work Situation Employment situation: Leave of absence Where is patient currently employed?: [employer], 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 Patient's job has been impacted by current illness: Yes Describe how patient's job has been impacted: reduced performance, passion  What is the longest time patient has a held a job?: 3 years Where was the patient employed at that time?: [employer] Has patient ever been in the [employer]?: No Has patient ever served in combat?: No Did You Receive Any Psychiatric Treatment/Services While in Equities trader?: No Are There Guns or Education officer, community in Your Home?: No   Developmental History:Noncontributory Prenatal History:  Birth History:  Postnatal Infancy: Developmental History: Milestones:  Sit-Up:   Crawl:  Walk:   Speech:  School History: Event organiser History:NONE Hobbies/Interests:Plays drums/Loves cooking/Bike riding/Skateboarding   Additional Social History: See Narrative  Allergies:   Allergies  Allergen Reactions  . Dilaudid [Hydromorphone Hcl]     Metabolic Disorder Labs: Lab Results  Component Value Date   HGBA1C 5.3  02/19/2014   MPG 105 02/19/2014   No results found for: PROLACTIN Lab Results  Component Value Date   CHOL 188 02/19/2014   TRIG 154 (H) 02/19/2014   HDL 69 02/19/2014   CHOLHDL 2.7 02/19/2014   VLDL 31 02/19/2014   LDLCALC 88 02/19/2014     Current Medications: Current Outpatient Prescriptions  Medication Sig Dispense Refill  . acamprosate (CAMPRAL) 333 MG tablet Take 2 tablets (666 mg total) by mouth 3 (three) times daily. With meals 180 tablet 0  . naltrexone (DEPADE) 50 MG tablet Take 1 tablet (50 mg total) by mouth daily. 30 tablet 0  . nystatin (MYCOSTATIN) 100000 UNIT/ML suspension SWISH 4 TO 6 ML AND SWALLOW FOUR TIMES DAILY  0  . ondansetron (ZOFRAN) 8 MG tablet Take 1 tablet 3 x / day before meals for Nausea 30 tablet 1  . sertraline (ZOLOFT) 100 MG tablet Take 1 tablet daily - for mood 90 tablet 1   No current facility-administered medications for this visit.     Neurologic: Headache: Yes Seizure: Negative Paresthesias:Negative  Musculoskeletal: Strength & Muscle Tone: within normal limits Gait & Station: normal Patient leans: N/A Lt handed  Psychiatric Specialty Exam: Review of Systems  Constitutional: Positive for chills, diaphoresis, malaise/fatigue and weight loss. Negative for fever.       Alcohol use and withdrawal  HENT: Negative.  Negative for congestion, ear discharge, ear pain, hearing loss, nosebleeds, sinus pain, sore throat and tinnitus.   Eyes: Positive for photophobia. Negative for blurred vision, double vision, pain, discharge and redness.  Respiratory: Negative for cough, hemoptysis, sputum production, shortness of breath, wheezing and stridor.   Cardiovascular: Negative.  Negative for chest pain, palpitations, orthopnea, claudication, leg swelling and PND.  Gastrointestinal: Positive for diarrhea, nausea and vomiting. Negative for abdominal pain, blood in stool, constipation and melena.       Etoh withdrawal /none in past few days   Genitourinary: Negative.  Negative for dysuria, flank pain, frequency, hematuria and urgency.  Musculoskeletal: Negative.  Negative for back pain, falls, joint pain, myalgias and neck pain.  Skin: Positive for itching. Negative for rash.  Neurological: Positive for tremors, weakness (loss of appetite) and headaches. Negative for dizziness, tingling, sensory change, speech change, focal weakness, seizures and loss of consciousness.  Endo/Heme/Allergies: Negative for environmental allergies and polydipsia. Bruises/bleeds easily.  Psychiatric/Behavioral: Positive for depression, memory loss and substance abuse. Negative for hallucinations and suicidal ideas. The patient is nervous/anxious and has insomnia.     Blood pressure 128/76, pulse 75, height 5' 10.5" (1.791 m), weight 148 lb 9.6 oz (67.4 kg).Body mass index is 21.02 kg/m.  General Appearance: Well Groomed  Eye Contact:  Good  Speech:  Clear and Coherent  Volume:  Normal  Mood:  Slighlty dysphoric/anxious but cooperative  Affect:  Congruent  Thought Process:  Coherent, Goal Directed and Descriptions of Associations: Intact  Orientation:  Full (Time, Place, and Person)  Thought Content:  WDL, Logical and neurotic anxiety  Suicidal Thoughts:  No  Homicidal Thoughts:  No  Memory:  says he has had some short term loss  Judgement:  Impaired  Insight:  Lacking  Psychomotor Activity:  Slight tremor  Concentration:  Concentration: intact for visit and Attention Span: intact for visit  Recall:  Good  Fund of Knowledge:Good except around his addictions  Language: Good  Akathisia:  NA  Handed:  Left  AIMS (if indicated):  NA  Assets:  Communication Skills Desire for Improvement Financial Resources/Insurance Housing Resilience Social Support Talents/Skills Vocational/Educational  ADL's:  Intact  Cognition: WNL  Sleep:  Improving    Treatment Plan Summary: Treatment Plan/Recommendations:  Plan of Care: [facility] CD IOP  Laboratory:   UDS per protocol/others per PCP  Psychotherapy:IOP Group;In dividual and family  Medications: Campral and Naltrexone MAT/DC Xanax Continue Zoloft  Routine PRN Medications:  Negative  Consultations: None  Safety Concerns:  Relapse  Other:  NA    Maryjean Morn, PA-C 11/8/20174:41 PM

## 2016-07-30 ENCOUNTER — Other Ambulatory Visit (HOSPITAL_COMMUNITY): Payer: 59 | Admitting: Psychology

## 2016-07-30 DIAGNOSIS — F1220 Cannabis dependence, uncomplicated: Secondary | ICD-10-CM

## 2016-07-30 DIAGNOSIS — F411 Generalized anxiety disorder: Secondary | ICD-10-CM

## 2016-07-30 DIAGNOSIS — F1020 Alcohol dependence, uncomplicated: Secondary | ICD-10-CM

## 2016-07-30 DIAGNOSIS — F10280 Alcohol dependence with alcohol-induced anxiety disorder: Secondary | ICD-10-CM | POA: Diagnosis not present

## 2016-07-30 DIAGNOSIS — F1994 Other psychoactive substance use, unspecified with psychoactive substance-induced mood disorder: Secondary | ICD-10-CM

## 2016-07-31 NOTE — Progress Notes (Signed)
    Daily Group Progress Note  Program: CD-IOP   07/31/2016 Manuel Thomas [id number]  Diagnosis:  No diagnosis found.   Sobriety Date: 07/26/16  Group Time: 1-2:30  Participation Level: Minimal  Behavioral Response: Appropriate and Sharing  Type of Therapy: Process Group  Interventions: Motivational Interviewing and Solution Focused  Topic: Patients met with counselors for 1.5 hour group process session. Patients were active and engaged. They took turns reporting on their thoughts and feelings in the last 24 hours since our last group meeting. Some reported significant conflicts with friends and loved ones. Some reported on AA/NA attendance. Counselors used open questions, solution focused techniques, and reflection to deepen patients' connections with each other, build support, and grow understanding of recovery life.     Group Time:   Participation Level: Minimal  Behavioral Response: Appropriate and Sharing  Type of Therapy: Psycho-education Group  Interventions: Motivational Interviewing, Solution Focused and Family Systems  Topic: Patients met with counselors for 1.5 hour group psychoeducation session. Patients were active and engaged. Counselors led psychodrama activity entitled "family sculpture". Patients used group members to model their family of origin and describe their childhood to other group members. Counselors used reflection and linking to deepen the groups' feelings of empathy and understanding for past trauma and stress from childhood. Patients reported feeling "relieved and enlightened" after doing the sculptures.   Summary: Patient was somewhat active and engaged in group. He checked-in last and reported that he was still "shy about sharing in group" since he is new. Patient shared about his last 24 hours, in which he attended an [place] meeting, met with a supportive friend for lunch and increased his sharing about recovery. He stated he began reading his  [place] and enjoyed it. He stated he attended a dentist appointment and seems to be engaging in healthy behaviors. Patient stated he went to his gym but was overwhelmed by how crowded it was. Patient displayed an eagerness and growing insight into recovery and how to sustain sobriety. [name], LPCA   UDS collected: No Results:   AA/NA attended?: YesWednesday  Sponsor?: No   [name], LCAS 07/31/2016 12:41 PM

## 2016-08-02 NOTE — Progress Notes (Signed)
    Daily Group Progress Note  Program: CD-IOP   08/02/2016 Jesse Sans [id number]  Diagnosis:  Alcohol use disorder, severe, dependence (HCC)  Cannabis use disorder, severe, dependence ([place])  Substance induced mood disorder ([place])  Anxiety neurosis  Prediabetes  Labile hypertension  Mixed hyperlipidemia  Acute alcoholic hepatitis  Family history of alcoholism in paternal grandfather  Family history of alcoholism in father   Sobriety Date: 11/5  Group Time: 1-2:30 pm  Participation Level: Active  Behavioral Response: Appropriate  Type of Therapy: Process Group  Interventions: Supportive  Topic: Process: Counselors met with patients to discuss recovery-related activities they had engaged in to support their sobriety. A new patient introduced himself to the group and shared reasons for pursuing treatment. All members were active and engaged in the discussion concerning recovery from mind-altering drugs and alcohol. During group today the Investment banker, operational met with a number of members to discuss medication.   Group Time: 2:30-4 pm  Participation Level: Minimal  Behavioral Response: Appropriate  Type of Therapy: Psycho-education Group  Interventions: Systems analyst  Topic: Psychoeducation: A chaplain from [place] met with the group to discuss issues related to relational intimacy in recovery. Intimacy was discussed in the context of family, friends, sponsors, and partners. The counselor led the discussion regarding boundaries or walls that group members use to protect themselves, as well as the pros and cons of doing so. Drug tests were collected from two group members.   Summary: The patient was new to the group today. He presented as active and engaged in the session. He introduced himself to the group and shared some of his reasons for seeking treatment. He admitted  to "being an alcoholic" and that he "had a problem." He reports being excused from  work until December 1st and that he intends to use this time to fully focus on his recovery. The patient admitted he has cravings every day, but has been able to fight them off and has remained sober. The group encouraged him to continue attending meetings. During the psycho-ed with the visiting chaplain, the patient met with the program director. He confirmed he is engaged in [place] and had recently attended 2 [place] meetings.    UDS collected: No Results:   AA/NA attended?: [place] and Tuesday  Sponsor?: No   [name], LCAS 08/02/2016 2:56 PM

## 2016-08-03 ENCOUNTER — Other Ambulatory Visit (HOSPITAL_COMMUNITY): Payer: Self-pay | Admitting: Medical

## 2016-08-03 ENCOUNTER — Other Ambulatory Visit (HOSPITAL_COMMUNITY): Payer: 59 | Admitting: Psychology

## 2016-08-03 DIAGNOSIS — F10280 Alcohol dependence with alcohol-induced anxiety disorder: Secondary | ICD-10-CM | POA: Diagnosis not present

## 2016-08-03 DIAGNOSIS — F1020 Alcohol dependence, uncomplicated: Secondary | ICD-10-CM

## 2016-08-03 DIAGNOSIS — F1220 Cannabis dependence, uncomplicated: Secondary | ICD-10-CM

## 2016-08-03 DIAGNOSIS — F1994 Other psychoactive substance use, unspecified with psychoactive substance-induced mood disorder: Secondary | ICD-10-CM

## 2016-08-04 ENCOUNTER — Encounter (HOSPITAL_COMMUNITY): Payer: Self-pay | Admitting: Psychology

## 2016-08-04 DIAGNOSIS — F1994 Other psychoactive substance use, unspecified with psychoactive substance-induced mood disorder: Secondary | ICD-10-CM

## 2016-08-04 DIAGNOSIS — F1020 Alcohol dependence, uncomplicated: Secondary | ICD-10-CM

## 2016-08-04 DIAGNOSIS — F1220 Cannabis dependence, uncomplicated: Secondary | ICD-10-CM

## 2016-08-04 NOTE — Progress Notes (Signed)
    Daily Group Progress Note  Program: CD-IOP   08/04/2016 MICAL KICKLIGHTER [id number]  Diagnosis:  Alcohol use disorder, severe, dependence ([place])  Cannabis use disorder, severe, dependence ([place])  Substance induced mood disorder ([place])   Sobriety Date: 07/26/16  Group Time: 1-2:30  Participation Level: Active  Behavioral Response: Appropriate and Sharing  Type of Therapy: Process Group  Interventions: CBT, Motivational Interviewing and Solution Focused  Topic: Patients met with counselors and intern for 1.5-hour group process session. Members were active and engaged in session, describing the recent events, thoughts, feelings, and behaviors since the last group. UDS tests were collected from some members. Counselor began session with 5-minute breathing exercise to increase awareness and practice of mindfulness. One patient became upset when pressed to "reveal his pain" and told the group he will "not talk anymore today". Counselors and intern used motivational interviewing and immediacy to help patients share from their present experience.     Group Time: 2:30-4  Participation Level: Active  Behavioral Response: Appropriate and Sharing  Type of Therapy: Psycho-education Group  Interventions: Motivational Interviewing, Solution Focused, Counsellor and Other: Five Love Languages  Topic: Counselors and intern led psychoeducation session on "The Five Love Languages" by [employer]. Counselors taught strategies for effectively communicating a love language and discussed ways to increase patient's intimacy with close friends and loved ones. Patients were given a 10-minute assessment to discover their primary love language. All patients were engaged and shared thoughtfully with each other about intimacy and growing connection with others.    Summary: Patient was active and engaged in group today. He stated that he attended 2 AA meetings. He reported that he shared  his current situation with his older brother who lives in [place]. Patient stated that he had been "hiding and avoiding" telling him the truth for months and decided to be honest. When counselor inquired, patient stated he felt "relieved" to tell his brother and felt supported. Patient described his weekend as "productive and fulfilling" since he worked out, did yard work with his dad, and felt "emotionally connected to his parents, which was better than being drunk around them." Patient verbalized lessening "paranoia about getting caught drinking". Patient stated he has no previous hx of meditation and enjoyed the 5 min mindfulness exercise since it relaxed him. Patient stated his stomach hurts throughout the day and he struggles to fall asleep at night. Patient has started MAT and was given psychoeducation about early side-effects. [name], LPC-A   UDS collected: Yes Results: positive for marijuana  AA/NA attended?: YesMonday  Sponsor?: No   [name], LCAS 08/04/2016 4:40 PM

## 2016-08-05 ENCOUNTER — Encounter (HOSPITAL_COMMUNITY): Payer: Self-pay | Admitting: Psychology

## 2016-08-05 ENCOUNTER — Other Ambulatory Visit (HOSPITAL_COMMUNITY): Payer: 59 | Admitting: Psychology

## 2016-08-05 DIAGNOSIS — F10280 Alcohol dependence with alcohol-induced anxiety disorder: Secondary | ICD-10-CM | POA: Diagnosis not present

## 2016-08-05 DIAGNOSIS — F1020 Alcohol dependence, uncomplicated: Secondary | ICD-10-CM

## 2016-08-05 DIAGNOSIS — F1220 Cannabis dependence, uncomplicated: Secondary | ICD-10-CM

## 2016-08-05 DIAGNOSIS — K7010 Alcoholic hepatitis without ascites: Secondary | ICD-10-CM

## 2016-08-05 NOTE — Progress Notes (Signed)
Manuel Thomas is a 25 y.o. male patient. CD-IOP TREATMENT PLANNING INDIVIDUAL SESSION Counselor met with patient for 50 min individual session to discuss tx plan, sign tx documentation, and discuss goals and plan for patient experience in CD-IOP. Counselor used open questions, reflection, and empathy to build rapport and establish a working relationship. Patient presented as active and engaged in session. He displayed some slight hand twitching which could be due to withdrawal and/or anxiety in session. 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 Patient reported having short bursts of annoying but not debilitating "twitching or jolting" for unknown reasons. Counselor suggested they monitor the behavior and consult Darlyne Russian in one week. Patient stated his parents have verbalized that he seems to be presenting as more engaged, upbeat, and interested in connecting with family members while at home. Patient agreed, and stated this may be because he is "no longer paranoid about getting caught". Patient continued to discuss his feelings of relief and liberation after divulging to his parents and brother the specific consequences of his alcohol addiction. Patient has begun attending a gym to play basketball, cardio workout, and strength training for at least 1-2 hours/ 3 times per week. Patient reports enjoying AA, attending at least 4 meetings/week and "meeting lots of people and getting phone numbers to contact". Counselor then presented tx paperwork and had patient participate in development of tx goals. Patient identified goals as: 1. Need to stay sober 2. Need for social support 3. Need to improve self care deficits 4. Process and relieve symptoms of grief from loss of recent relationship w/ girlfriend of 3 years. Counselor discussed measurable ways to meet these goals. Patient agreed and displayed high  motivation to change his behavior,thoughts,feelings and sustain recovery-related activities. Patient signed tx plan and discussed time for follow up individual session. (See attached Treatment Plan in "media" section of EPIC) Wes Swan, LPCA       [name], LCAS

## 2016-08-06 ENCOUNTER — Ambulatory Visit (INDEPENDENT_AMBULATORY_CARE_PROVIDER_SITE_OTHER): Payer: 59 | Admitting: Internal Medicine

## 2016-08-06 ENCOUNTER — Other Ambulatory Visit (HOSPITAL_COMMUNITY): Payer: 59 | Admitting: Psychology

## 2016-08-06 ENCOUNTER — Encounter: Payer: Self-pay | Admitting: Internal Medicine

## 2016-08-06 VITALS — BP 136/86 | HR 66 | Temp 98.0°F | Resp 16 | Ht 70.0 in | Wt 148.0 lb

## 2016-08-06 DIAGNOSIS — Z1212 Encounter for screening for malignant neoplasm of rectum: Secondary | ICD-10-CM

## 2016-08-06 DIAGNOSIS — E782 Mixed hyperlipidemia: Secondary | ICD-10-CM

## 2016-08-06 DIAGNOSIS — Z0000 Encounter for general adult medical examination without abnormal findings: Secondary | ICD-10-CM

## 2016-08-06 DIAGNOSIS — Z23 Encounter for immunization: Secondary | ICD-10-CM

## 2016-08-06 DIAGNOSIS — E559 Vitamin D deficiency, unspecified: Secondary | ICD-10-CM

## 2016-08-06 DIAGNOSIS — F1220 Cannabis dependence, uncomplicated: Secondary | ICD-10-CM

## 2016-08-06 DIAGNOSIS — R5383 Other fatigue: Secondary | ICD-10-CM

## 2016-08-06 DIAGNOSIS — Z0001 Encounter for general adult medical examination with abnormal findings: Secondary | ICD-10-CM

## 2016-08-06 DIAGNOSIS — R0989 Other specified symptoms and signs involving the circulatory and respiratory systems: Secondary | ICD-10-CM

## 2016-08-06 DIAGNOSIS — F1020 Alcohol dependence, uncomplicated: Secondary | ICD-10-CM

## 2016-08-06 DIAGNOSIS — F10280 Alcohol dependence with alcohol-induced anxiety disorder: Secondary | ICD-10-CM | POA: Diagnosis not present

## 2016-08-06 DIAGNOSIS — R7303 Prediabetes: Secondary | ICD-10-CM

## 2016-08-06 DIAGNOSIS — Z79899 Other long term (current) drug therapy: Secondary | ICD-10-CM

## 2016-08-06 LAB — LIPID PANEL
CHOLESTEROL: 180 mg/dL (ref <200)
HDL: 55 mg/dL (ref >40)
LDL Cholesterol: 104 mg/dL — ABNORMAL HIGH (ref <100)
TRIGLYCERIDES: 106 mg/dL (ref <150)
Total CHOL/HDL Ratio: 3.3 Ratio (ref <5.0)
VLDL: 21 mg/dL (ref <30)

## 2016-08-06 LAB — CBC WITH DIFFERENTIAL/PLATELET
BASOS ABS: 76 {cells}/uL (ref 0–200)
Basophils Relative: 1 %
EOS PCT: 2 %
Eosinophils Absolute: 152 cells/uL (ref 15–500)
HCT: 49.4 % (ref 38.5–50.0)
HEMOGLOBIN: 16.7 g/dL (ref 13.2–17.1)
LYMPHS ABS: 2204 {cells}/uL (ref 850–3900)
LYMPHS PCT: 29 %
MCH: 32.6 pg (ref 27.0–33.0)
MCHC: 33.8 g/dL (ref 32.0–36.0)
MCV: 96.3 fL (ref 80.0–100.0)
MONOS PCT: 10 %
MPV: 10.9 fL (ref 7.5–12.5)
Monocytes Absolute: 760 cells/uL (ref 200–950)
NEUTROS PCT: 58 %
Neutro Abs: 4408 cells/uL (ref 1500–7800)
Platelets: 317 10*3/uL (ref 140–400)
RBC: 5.13 MIL/uL (ref 4.20–5.80)
RDW: 14.8 % (ref 11.0–15.0)
WBC: 7.6 10*3/uL (ref 3.8–10.8)

## 2016-08-06 LAB — CARBOXY-THC,NORMALIZED RATIO
CARBOXY-THC UR: 38 ng/mL
THC/CR RATIO UR: 109 ng/mg{creat}

## 2016-08-06 LAB — BASIC METABOLIC PANEL WITH GFR
BUN: 3 mg/dL — ABNORMAL LOW (ref 7–25)
CALCIUM: 9.7 mg/dL (ref 8.6–10.3)
CO2: 25 mmol/L (ref 20–31)
CREATININE: 0.78 mg/dL (ref 0.60–1.35)
Chloride: 101 mmol/L (ref 98–110)
GFR, Est Non African American: >89 mL/min (ref >=60)
GLUCOSE: 57 mg/dL — AB (ref 65–99)
Potassium: 3.8 mmol/L (ref 3.5–5.3)
SODIUM: 140 mmol/L (ref 135–146)

## 2016-08-06 LAB — URINE DRUG SCREEN 701203 - MAT
6-ACETYLMORPHINE UR: NEGATIVE ng/mL
AMPHETAMINES UR: NEGATIVE ng/mL
Barbiturates: NEGATIVE ng/mL
Benzodiazepines: NEGATIVE ng/mL
Buprenorphine: NEGATIVE ng/mL
CREATININE UR: 35 mg/dL (ref >=20)
Carisoprodol: NEGATIVE ng/mL
Cocaine Metabolite: NEGATIVE ng/mL
ETHYL GLUCURONIDE UR: NEGATIVE ng/mL
Fentanyl: NEGATIVE ng/mL
GABAPENTIN UR: NEGATIVE ug/mL
Methadone: NEGATIVE ng/mL
Nitrites: NEGATIVE ug/mL
OXYCODONE UR: NEGATIVE ng/mL
Opiates: NEGATIVE ng/mL
PHENCYCLIDINE (PCP) UR: NEGATIVE ng/mL
Propoxyphene: NEGATIVE ng/mL
TAPENTADOL UR: NEGATIVE ng/mL
Tramadol: NEGATIVE ng/mL
Urine pH: 6.6 (ref 4.5–8.9)

## 2016-08-06 LAB — TSH: TSH: 2 mIU/L (ref 0.40–4.50)

## 2016-08-06 LAB — HEPATIC FUNCTION PANEL
ALT: 34 U/L (ref 9–46)
AST: 24 U/L (ref 10–40)
Albumin: 4.5 g/dL (ref 3.6–5.1)
Alkaline Phosphatase: 77 U/L (ref 40–115)
BILIRUBIN DIRECT: 0.1 mg/dL (ref <=0.2)
Indirect Bilirubin: 0.4 mg/dL (ref 0.2–1.2)
TOTAL PROTEIN: 6.9 g/dL (ref 6.1–8.1)
Total Bilirubin: 0.5 mg/dL (ref 0.2–1.2)

## 2016-08-06 NOTE — Progress Notes (Signed)
[facility]   Lucky CowboyWilliam Ival Pacer, [name]    Dyanne CarrelAmanda R. Steffanie Dunnollier, P.A.-C      Terri Piedraourtney Forcucci, P.A.-C  [facility]                [address]                [place], [place] [id number] Telephone [phone]) [phone] Telefax [phone] Annual  Screening/Preventative Visit  & Comprehensive Evaluation & Examination     This very nice 24 y.o.male presents for a Screening/Preventative Visit & comprehensive evaluation and management.  Patient is screened p[roactively for elevated BP, lipids, glucose and Vitamin D Deficiency. 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       Patient also has hx/o borderline elevations of BP in the past Patient's BP has been controlled and today's BP is again borderline elevated - 136/86. Patient denies any cardiac symptoms as chest pain, palpitations, shortness of breath, dizziness or ankle swelling.     Patient has hx/o elevated lipids in the past and is screened proactively for hyperlipidemia.  Last lipids were at goal: Lab Results  Component Value Date   CHOL 188 02/19/2014   HDL 69 02/19/2014   LDLCALC 88 02/19/2014   TRIG 154 (H) 02/19/2014   CHOLHDL 2.7 02/19/2014      Patient is screened proactively for prediabetes with hx/o elevated A1c 5.7% in 2013 and patient denies reactive hypoglycemic symptoms, visual blurring, diabetic polys or paresthesias. Last A1c was normal: Lab Results  Component Value Date   HGBA1C 5.3 02/19/2014       Finally, patient has history of Vitamin D Deficiency  In 2012 of "23" and last vitamin D was  Still very very low (goal is betw 70-100): Lab Results  Component Value Date   VD25OH 25 (L) 04/29/2015   Current Outpatient Prescriptions  on File Prior to Visit  Medication Sig  . acamprosate (CAMPRAL) 333 MG tablet Take 2 tablets (666 mg total) by mouth 3 (three) times daily. With meals  . naltrexone (DEPADE) 50 MG tablet Take 1 tablet (50 mg total) by mouth daily.  . sertraline  100 MG tablet Take 1 tablet daily - for mood   Allergies  Allergen Reactions  . Dilaudid [Hydromorphone Hcl]    Past Medical History:  Diagnosis Date  . Asthma   . Prediabetes   . Vitamin D deficiency    Health Maintenance  Topic Date Due  . INFLUENZA VACCINE  04/21/2016  . TETANUS/TDAP  08/21/2022  . HIV Screening  Completed   Immunization History  Administered Date(s) Administered  . Influenza,inj,quad, With Preservative 08/06/2016  . PPD Test 02/19/2014, 04/29/2015  . Td 08/21/2012   Past Surgical History:  Procedure Laterality Date  . ADENOIDECTOMY    . CLOSED REDUCTION FOREARM FRACTURE Left 2005  . INGUINAL HERNIA REPAIR  2003  . TONSILLECTOMY     Family History  Problem Relation Age of Onset  . Heart disease Mother   . Heart attack Other   . Arthritis Other   . Hypertension Other   . Diabetes Other   . Stroke Other    Social History   Social History  . Marital status: Single    Spouse name: N/A  . Number  of children: N/A  . Years of education: N/A   Occupational History  . Not on file.   Social History Main Topics  . Smoking status: Current Every Day Smoker    Types: Cigarettes  . Smokeless tobacco: Never Used  . Alcohol use Yes  . Drug use: No  . Sexual activity: Not Currently    ROS Constitutional: Denies fever, chills, weight loss/gain, headaches, insomnia,  night sweats or change in appetite. Does c/o fatigue. Eyes: Denies redness, blurred vision, diplopia, discharge, itchy or watery eyes.  ENT: Denies discharge, congestion, post nasal drip, epistaxis, sore throat, earache, hearing loss, dental pain, Tinnitus, Vertigo, Sinus pain or snoring.  Cardio: Denies chest pain, palpitations, irregular  heartbeat, syncope, dyspnea, diaphoresis, orthopnea, PND, claudication or edema Respiratory: denies cough, dyspnea, DOE, pleurisy, hoarseness, laryngitis or wheezing.  Gastrointestinal: Denies dysphagia, heartburn, reflux, water brash, pain, cramps, nausea, vomiting, bloating, diarrhea, constipation, hematemesis, melena, hematochezia, jaundice or hemorrhoids Genitourinary: Denies dysuria, frequency, urgency, nocturia, hesitancy, discharge, hematuria or flank pain Musculoskeletal: Denies arthralgia, myalgia, stiffness, Jt. Swelling, pain, limp or strain/sprain. Denies Falls. Skin: Denies puritis, rash, hives, warts, acne, eczema or change in skin lesion Neuro: No weakness, tremor, incoordination, spasms, paresthesia or pain Psychiatric: Denies confusion, memory loss or sensory loss. Denies Depression. Endocrine: Denies change in weight, skin, hair change, nocturia, and paresthesia, diabetic polys, visual blurring or hyper / hypo glycemic episodes.  Heme/Lymph: No excessive bleeding, bruising or enlarged lymph nodes.  Physical Exam  BP 136/86   Pulse 66   Temp 98 F (36.7 C) (Temporal)   Resp 16   Ht 5\' 10"  (1.778 m)   Wt 148 lb (67.1 kg)   BMI 21.24 kg/m   General Appearance: Well nourished, in no apparent distress.  Eyes: PERRLA, EOMs, conjunctiva no swelling or erythema, normal fundi and vessels. Sinuses: No frontal/maxillary tenderness ENT/Mouth: EACs patent / TMs  nl. Nares clear without erythema, swelling, mucoid exudates. Oral hygiene is good. No erythema, swelling, or exudate. Tongue normal, non-obstructing. Tonsils not swollen or erythematous. Hearing normal.  Neck: Supple, thyroid normal. No bruits, nodes or JVD. Respiratory: Respiratory effort normal.  BS equal and clear bilateral without rales, rhonci, wheezing or stridor. Cardio: Heart sounds are normal with regular rate and rhythm and no murmurs, rubs or gallops. Peripheral pulses are normal and equal bilaterally without  edema. No aortic or femoral bruits. Chest: symmetric with normal excursions and percussion.  Abdomen: Soft, with Nl bowel sounds. Nontender, no guarding, rebound, hernias, masses, or organomegaly.  Lymphatics: Non tender without lymphadenopathy.  Musculoskeletal: Full ROM all peripheral extremities, joint stability, 5/5 strength, and normal gait. Skin: Warm and dry without rashes, lesions, cyanosis, clubbing or  ecchymosis.  Neuro: Cranial nerves intact, reflexes equal bilaterally. Normal muscle tone, no cerebellar symptoms. Sensation intact.  Pysch: Alert and oriented X 3 with normal affect, insight and judgment appropriate.   Assessment and Plan  1. Annual Preventative/Screening Exam   - Microalbumin / creatinine urine ratio - POC Hemoccult Bld/Stl  - Urinalysis, Routine w reflex microscopic  - Vitamin B12 - Iron and TIBC - Testosterone - Protime-INR - CBC with Differential/Platelet - BASIC METABOLIC PANEL WITH GFR - Hepatic function panel - Magnesium - Lipid panel - TSH - Hemoglobin A1c - Insulin, random - VITAMIN D 25 Hydroxy  - EKG 12-Lead  2. Labile hypertension  - Microalbumin / creatinine urine ratio - TSH - EKG 12-Lead  3. Mixed hyperlipidemia  - Lipid panel - TSH - EKG 12-Lead  4.  Prediabetes  - Hemoglobin A1c - Insulin, random  5. Vitamin D deficiency  - VITAMIN D 25 Hydroxy   6. Screening for rectal cancer  - POC Hemoccult Bld/Stl   7. Other fatigue  - Vitamin B12 - Iron and TIBC - Testosterone - TSH  8. Medication management  - Urinalysis, Routine w reflex microscopic  - Protime-INR - CBC with Differential/Platelet - BASIC METABOLIC PANEL WITH GFR - Hepatic function panel - Magnesium  9. Need for prophylactic vaccination and inoculation against influenza  - Flu Vaccine QUAD with presevative        Continue prudent diet as discussed, weight control, BP monitoring, regular exercise, and medications as discussed.  Discussed med  effects and SE's. Routine screening labs and tests as requested with regular follow-up as recommended. Over 40 minutes of exam, counseling, chart review and high complex critical decision making was performed. Recommended F/U in 3 months.

## 2016-08-06 NOTE — Patient Instructions (Signed)

## 2016-08-07 LAB — PROTIME-INR
INR: 1
PROTHROMBIN TIME: 10.2 s (ref 9.0–11.5)

## 2016-08-07 LAB — URINALYSIS, ROUTINE W REFLEX MICROSCOPIC
Bilirubin Urine: NEGATIVE
Glucose, UA: NEGATIVE
HGB URINE DIPSTICK: NEGATIVE
Ketones, ur: NEGATIVE
LEUKOCYTES UA: NEGATIVE
NITRITE: NEGATIVE
PH: 6.5 (ref 5.0–8.0)
Protein, ur: NEGATIVE
SPECIFIC GRAVITY, URINE: 1.006 (ref 1.001–1.035)

## 2016-08-07 LAB — HEMOGLOBIN A1C
HEMOGLOBIN A1C: 5 % (ref <5.7)
MEAN PLASMA GLUCOSE: 97 mg/dL

## 2016-08-07 LAB — IRON AND TIBC
%SAT: 32 % (ref 15–60)
Iron: 116 ug/dL (ref 50–195)
TIBC: 365 ug/dL (ref 250–425)
UIBC: 249 ug/dL (ref 125–400)

## 2016-08-07 LAB — INSULIN, RANDOM: Insulin: 2.2 u[IU]/mL (ref 2.0–19.6)

## 2016-08-07 LAB — MICROALBUMIN / CREATININE URINE RATIO
Creatinine, Urine: 54 mg/dL (ref 20–370)
Microalb Creat Ratio: 7 mcg/mg creat (ref <30)
Microalb, Ur: 0.4 mg/dL

## 2016-08-07 LAB — MAGNESIUM: MAGNESIUM: 2.1 mg/dL (ref 1.5–2.5)

## 2016-08-07 LAB — TESTOSTERONE: TESTOSTERONE: 1023 ng/dL — AB (ref 250–827)

## 2016-08-07 LAB — VITAMIN B12: Vitamin B-12: 429 pg/mL (ref 200–1100)

## 2016-08-07 LAB — VITAMIN D 25 HYDROXY (VIT D DEFICIENCY, FRACTURES): VIT D 25 HYDROXY: 31 ng/mL (ref 30–100)

## 2016-08-10 ENCOUNTER — Other Ambulatory Visit (HOSPITAL_COMMUNITY): Payer: 59 | Admitting: Psychology

## 2016-08-10 ENCOUNTER — Encounter (HOSPITAL_COMMUNITY): Payer: Self-pay | Admitting: Psychology

## 2016-08-10 DIAGNOSIS — F1220 Cannabis dependence, uncomplicated: Secondary | ICD-10-CM

## 2016-08-10 DIAGNOSIS — F1020 Alcohol dependence, uncomplicated: Secondary | ICD-10-CM

## 2016-08-10 DIAGNOSIS — F10280 Alcohol dependence with alcohol-induced anxiety disorder: Secondary | ICD-10-CM | POA: Diagnosis not present

## 2016-08-10 NOTE — Progress Notes (Signed)
Daily Group Progress Note  Program: CD-IOP   08/10/2016 Manuel Thomas [id number]  Diagnosis:  No diagnosis found.   Sobriety Date: 11/5  Group Time: 1-2:30 pm  Participation Level: Active  Behavioral Response: Appropriate and Sharing  Type of Therapy: Process Group  Interventions: Supportive  Topic: Process: Counselors met with patients to discuss recovery-related activities they had engaged in to support their sobriety. Two new patients introduced themselves to the group and shared reasons for pursuing treatment. All members were active and engaged in the discussion concerning recovery from mind-altering drugs and alcohol.  Group Time: 2:30-4 pm  Participation Level: Active  Behavioral Response: Appropriate  Type of Therapy: Psycho-education Group  Interventions: Strength-based  Topic: Psychoeducation: Counselors led a discussion on the serenity prayer and how it related to recovery. Patients were asked to list things they had control over verses things they did not and share them with the group. A group member met with the program director to discuss discharge procedures. Counselors concluded the group by discussing holiday scheduling and the importance of prioritizing recovery.   Summary: The patient presented as active and engaged in group. He shared that he had been working out as part of his goal to focus on self-care. Since he has had time off from work, he has focused on getting his apartment in order, getting enough sleep, and eating well. The counselors encouraged him to continue pursuing self-care activities after he returns to work. He shared that he has been "frustrated" when coworkers have asked him if he is "taking [his recovery] seriously this time?" He has been trying to channel this anger into pursuing his recovery - he wants to prove them wrong. He shared that he is working on having positive self-talk. The patient was unable to attend any  meetings.   UDS collected: No Results:   AA/NA attended?: No  Sponsor?: Yes   Ann Evans, LCAS 08/10/2016 5:19 PM 

## 2016-08-12 ENCOUNTER — Encounter (HOSPITAL_COMMUNITY): Payer: Self-pay | Admitting: Psychology

## 2016-08-12 ENCOUNTER — Other Ambulatory Visit (HOSPITAL_COMMUNITY): Payer: 59 | Admitting: Psychology

## 2016-08-12 DIAGNOSIS — F1220 Cannabis dependence, uncomplicated: Secondary | ICD-10-CM

## 2016-08-12 DIAGNOSIS — F1994 Other psychoactive substance use, unspecified with psychoactive substance-induced mood disorder: Secondary | ICD-10-CM

## 2016-08-12 DIAGNOSIS — F10280 Alcohol dependence with alcohol-induced anxiety disorder: Secondary | ICD-10-CM | POA: Diagnosis not present

## 2016-08-12 DIAGNOSIS — F1020 Alcohol dependence, uncomplicated: Secondary | ICD-10-CM
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
## 2016-08-16 ENCOUNTER — Encounter (HOSPITAL_COMMUNITY): Payer: Self-pay | Admitting: Psychology

## 2016-08-16 NOTE — Progress Notes (Signed)
    Daily Group Progress Note  Program: CD-IOP   REAKWON BARREN [id number]  Diagnosis: F10.20  Sobriety Date: 11/5  Group Time: 1-2:30 pm  Participation Level: Active  Behavioral Response: Appropriate and Sharing  Type of Therapy: Process Group  Interventions: Supportive  Topic: Process: The first half of group was spent in process. Members discussed what they are doing to support their recovery and any challenges or obstacles that may have appeared since we last met. A new group member was present today and she introduced herself during this half of group.   Group Time: 2:30-4 pm  Participation Level: Active  Behavioral Response: Appropriate  Type of Therapy: Psycho-education Group  Interventions:   Topic: Psycho-Ed; Emotional Jenga/Graduation: the second half of group was spent in a psycho-ed. The topic was "Emotional Jenga" and consisted of group members circling their chairs around a table with the [place] tower in the middle. Members chose the pieces they drew and were asked to use them in a sentence and how they would address the emotion in recovery. There was good feedback and fun in this exercise. A graduation ceremony was held 20 minutes prior to the end of the session. Members enjoyed brownies and spoke to the graduating member. Her husband had arrived and was also present for this ceremony. She received validating loving compliments and wishes, and the ceremony proved very poignant.   Summary: The patient was attentive and engaged in group today. He had gone to the gym and had a very good workout. He had also had a physical with his PCP and the report was very good. The patient reported that he had attended an [place] meeting last night and it had been a good one. The meeting covered Step 3 and it was very helpful. The patient admitted he feels much better now both physically and mentally. During the [place] activity, the patient shared about himself and was able to identify  how the emotions relate to his life now. He spoke highly of the graduating member and wished her well. This patient is making good progress in early recovery, but he is not working, and his stress level is minimal. It remains to be seen whether he will feel as well once he returns to his place of employment. He responded well to this intervention.   UDS collected: No Results:   AA/NA attended?: YesWednesday  Sponsor?: No   [name], [place] 08/16/2016 12:08 PM

## 2016-08-16 NOTE — Progress Notes (Signed)
    Daily Group Progress Note  Program: CD-IOP   08/12/16 Manuel Thomas [id number]  Diagnosis: F10.20  Sobriety Date:   Group Time: 1-2:30 pm  Participation Level: Active  Behavioral Response: Appropriate and Sharing  Type of Therapy: Process Group  Interventions: Supportive  Topic: Process: The first half of group was spent in process. Group members described the things they had done since we last met to support their sobriety. They were also encouraged to share any thoughts of using, cravings or challenges to their recovery. The program director met with 2 group members and drug tests were collected from every member except for the member who was graduating today. During their check-ins, members shared briefly about their plans over the upcoming holiday weekend.   Group Time: 2:30-4 pm  Participation Level: Active  Behavioral Response: Sharing  Type of Therapy: Psycho-education Group  Interventions: Solution Focused  Topic: Psycho-Ed: Relapse Prevention, Part 2/Graduation: the second half of group was spent in a psycho-ed on relapse prevention. It was a continuation of the psycho-ed on Monday. Members reviewed the handout provided during the last session and a discussion ensued on challenging thoughts of using in early recovery. Members shared openly about some of their ambivalence, which is to be expected. During the last 20 minutes of group today a graduation ceremony was held to honor a member who was completing the program. His wife had arrived and joined the celebration as did a member who had graduated last week. Kind words and brownies were shared as the session ended.   Summary: The patient reported he had attended the "young people's meeting' on Monday evening. A friend whom he had known for a number of years was speaking. He had been sober for two and one-half years and it was a very moving speech. The patient noted that he met some other young men who skateboard  and are in recovery. They are going to skate together and he looks forward to getting to know these fellows. The patient shared that his family was getting together for the holiday at his parent's home. His aunt and uncle will be visiting, and he wants to tell them about his recent entry into treatment. The patient expressed the desire to let his loved ones know about the changes he is making and feels certain they will be pleased with this news. Another group member reminded him that they might be relieved and tell him how worried they had been about him and his drinking. During the psycho-ed, the patient admitted he had ambivalence about never being able to drink again. Other members could relate to this. He was encouraged to create a voice to answer his disease when it calls to him. The patient nodded as he recounted the painful memories and losses that his drinking had caused him. He had kind words to share with the graduating member and noted he would see him soon once he changes his AA schedule and attends more morning AA meetings when he returns to work. The patient responded well to this intervention and continues to make measurable progress in early recovery.   UDS collected: Yes Results: pending  AA/NA attended?: [facility]  Sponsor?: No   [name], [place] 08/16/2016 10:58 AM

## 2016-08-17 ENCOUNTER — Other Ambulatory Visit (HOSPITAL_COMMUNITY): Payer: 59 | Admitting: Psychology

## 2016-08-17 DIAGNOSIS — F10280 Alcohol dependence with alcohol-induced anxiety disorder: Secondary | ICD-10-CM | POA: Diagnosis not present

## 2016-08-17 DIAGNOSIS — F1994 Other psychoactive substance use, unspecified with psychoactive substance-induced mood disorder: Secondary | ICD-10-CM

## 2016-08-17 DIAGNOSIS — F1020 Alcohol dependence, uncomplicated: Secondary | ICD-10-CM

## 2016-08-17 DIAGNOSIS — F1220 Cannabis dependence, uncomplicated: Secondary | ICD-10-CM

## 2016-08-18 ENCOUNTER — Encounter (HOSPITAL_COMMUNITY): Payer: Self-pay | Admitting: Psychology

## 2016-08-18 DIAGNOSIS — F1994 Other psychoactive substance use, unspecified with psychoactive substance-induced mood disorder: Secondary | ICD-10-CM

## 2016-08-18 DIAGNOSIS — F1220 Cannabis dependence, uncomplicated: Secondary | ICD-10-CM

## 2016-08-18 DIAGNOSIS — F1020 Alcohol dependence, uncomplicated: Secondary | ICD-10-CM

## 2016-08-18 NOTE — Progress Notes (Signed)
Manuel Thomas is a 25 y.o. male patient.  CD-IOP INDIVIDUAL COUNSELING SESSION: Patient met with counselor for 1 hour individual session. Patient presented as active and engaged in session. He stated that he felt great and things were really improving for him. He reported that he had not had any significant cravings or triggers to use since our last meeting yesterday. Patient and counselor explored the patient's experience of CD-IOP thus far and detailed the ways that the tx is being helpful. Counselor and patient also discussed ways to improve tx by focusing on remaining goals of processing grief from recent break up with patient girlfriend. Counselor used open questions to learn more about patients past 3 year relationship with girlfriend. Patient stated they were "serious" and believes the relationship suffered from his lack of emotional communication and her lack of commitment. Patient and counselor discussed alternative ways of reacting to negative emotions in a relationship. Patient stated that drinking became his only coping skills for handling negative emotions. Patient reported that he is feeling more "confident in social settings, more sure of himself, and more interested in other people" thanks to group counseling. Patient continues to experience positive results from MAT, reduced cravings, and denies negative or intrusive thoughts. Patient reports that he is happy with his AA meetings and attending 4/week actively. Patient sobriety date remains 07/13/16. [name], LPCA      [name], LCAS

## 2016-08-18 NOTE — Progress Notes (Signed)
    Daily Group Progress Note  Program: CD-IOP   08/18/2016 SHERIFF RODENBERG [id number]  Diagnosis:  Alcohol use disorder, severe, dependence ([place])  Cannabis use disorder, severe, dependence ([place])  Substance induced mood disorder ([place])   Sobriety Date: 11/5  Group Time: 1-2:30  Participation Level: Active  Behavioral Response: Appropriate and Sharing  Type of Therapy: Process Group  Interventions: CBT and Solution Focused  Topic: Counselors met with patients for 1.5 hour group processing session. Patients were active and engaged and discussed the recent holiday break, challenges to their recovery process, and family interactions. UDS were collected from all patients present. 2 group members shared about lapsing over the break. One patient was absent with no known attempt to communicate. Another group member was absent and called to say she was sick at home. Some patients met with program director to discuss MAT.      Group Time: 2:30-4  Participation Level: Active  Behavioral Response: Appropriate and Sharing  Type of Therapy: Psycho-education Group  Interventions: CBT and Solution Focused  Topic: Counselors met with patients for 1.5 hour group psychoeducation session. Counselor led topical discussion on "Adult Children of Alcoholics". Patients identified characteristics of healthy and unhealthy families. Counselor also led a 5 minute guided imagery meditation about "Coping with Change". Patient processed their response to activity and meditation.    Summary: Patient was active and engaged in group session. He reported that he attended 3 12 Step Meetings since our last group. Patient shared that he had a positive holiday with his family and enjoyed spending time with his extended family members. Patient stated others commented on patient's appearance and the quality of time he spent with them. Patient reported he discovered a bottle of whiskey at a holiday party and  was able to successfully avoid drinking it and from turning his thought into a craving to drink. Patient reported he attended church with his family. He also injured himself in a skateboarding accident and reported he would be getting an x-ray on his right wrist soon. [name], LPCA    UDS collected: Yes Results: negative  AA/NA attended?: YesFriday and Saturday  Sponsor?: No   [name], LCAS 08/18/2016 2:10 PM

## 2016-08-19 ENCOUNTER — Other Ambulatory Visit (HOSPITAL_COMMUNITY): Payer: 59 | Admitting: Psychology

## 2016-08-19 DIAGNOSIS — F10280 Alcohol dependence with alcohol-induced anxiety disorder: Secondary | ICD-10-CM | POA: Diagnosis not present

## 2016-08-19 DIAGNOSIS — F1020 Alcohol dependence, uncomplicated: Secondary | ICD-10-CM

## 2016-08-19 DIAGNOSIS — F1994 Other psychoactive substance use, unspecified with psychoactive substance-induced mood disorder: Secondary | ICD-10-CM

## 2016-08-20 ENCOUNTER — Other Ambulatory Visit (HOSPITAL_COMMUNITY): Payer: 59 | Admitting: Psychology

## 2016-08-20 DIAGNOSIS — F10280 Alcohol dependence with alcohol-induced anxiety disorder: Secondary | ICD-10-CM | POA: Diagnosis not present

## 2016-08-20 DIAGNOSIS — F1020 Alcohol dependence, uncomplicated: Secondary | ICD-10-CM

## 2016-08-20 DIAGNOSIS — F1994 Other psychoactive substance use, unspecified with psychoactive substance-induced mood disorder: Secondary | ICD-10-CM

## 2016-08-21 NOTE — Progress Notes (Signed)
    Daily Group Progress Note  Program: CD-IOP   08/21/2016 Manuel Thomas [id number]  Diagnosis:  Alcohol use disorder, severe, dependence ([place])  Substance induced mood disorder ([place])   Sobriety Date: 07/26/16  Group Time: 1-2:30  Participation Level: Active  Behavioral Response: Appropriate and Sharing  Type of Therapy: Process Group  Interventions: CBT  Topic: Counselors met with patients to discuss the activities they had engaged in to support their recovery since the previous session. A new group member introduced himself to the group and shared his reasons for pursuing treatment. Most members were active and engaged in the discussion concerning recovery from mind-altering drugs and alcohol.      Group Time: 2:30-4  Participation Level: Active  Behavioral Response: Appropriate and Sharing  Type of Therapy: Psycho-education Group  Interventions: Strength-based and Supportive  Topic: Counselors continued the discussion from the previous session about characteristics of Adult Children of Alcoholics. The group members finished reading though the laundry list and were asked to identify which characteristics they personally identified with. The counselors then helped the patients create personal measurable goals for their treatment based on the characteristic they identified. Two group members met with the Investment banker, operational. A drug test was collected from one group member. PHQ-9 and GAD assessments were collected from all group members.    Summary: The patient presented as active and engaged in group. He shared that he went to the doctor and learned that his wrist was not broken. He is scheduled to return to work on 12/1 and hopes that his coworkers will see a positive change in him. He is planning to meet with his boss and management to discuss his treatment thus far. The patient shares that he is learning how to be "present" with himself rather than filling alone time with  mind-altering chemicals. This connects to his identified ACOA characteristic that he is afraid of abandonment. He is actively working on this with his Secretary/administrator. PHQ-9: 2 GAD: 3. The patient attended one [place] meeting.  [name], LPCA   UDS collected: No Results: negative  AA/NA attended?: YesTuesday  Sponsor?: No   [name], LCAS 08/21/2016 1:11 PM

## 2016-08-21 NOTE — Progress Notes (Signed)
    Daily Group Progress Note  Program: CD-IOP   08/21/2016 Manuel Thomas [id number]  Diagnosis:  Alcohol use disorder, severe, dependence ([place])  Substance induced mood disorder ([place])   Sobriety Date: 07/26/16  Group Time: 1-2:30  Participation Level: Active  Behavioral Response: Appropriate and Sharing  Type of Therapy: Process Group  Interventions: CBT  Topic: Counselor met with patients for 1.5 hour group process therapy session. Patients were active, engaged, and talked lively. Patients spoke on their recovery and sobriety from mind-altering drugs and alcohol. One UDS was collected from patient.      Group Time: 2:30-4  Participation Level: Active  Behavioral Response: Appropriate and Sharing  Type of Therapy: Psycho-education Group  Interventions: Meditation: MBRP  Topic: Counselor met with patients for 1.5 hour group psychoeducation therapy session. Counselor led discussing on and experience of mindfulness based relapse prevention (MBRP) for using meditation during recovery from mind-altering drugs and alcohol. Patients were moderately interested and presented as tired and somewhat disengaged.   Summary: Patient presented for CD-IOP and was active and engaged during check-in and psychoeducation. He reported that he attended 1 AA meeting and established the young people meeting as his homegroup. He stated he is getting closer to a male friend in [place] [facility] who is also a recent graduate of [facility] CD-I[facility]. Patient presented as hopeful and upbeat stated he is enjoying early recovery thus far and is excited to get back to his job. Patient stated he had a good meeting with his boss about returning to work today and is looking forward to being sober and working. Patient verbalized active plans for his upcoming weekend which included work and [employer]. Patient stated he is still working on getting a sponsor. Patient seemed especially interested and engaged during meditation and  psychoeducation which may reflect his desired goal of increasing his comfort with "time by himself and his self talk". [name], LPCA   UDS collected: No Results: negative  AA/NA attended?: YesWednesday  Sponsor?: No   [name], LCAS 08/21/2016 12:30 PM

## 2016-08-24 ENCOUNTER — Other Ambulatory Visit (HOSPITAL_COMMUNITY): Payer: 59 | Attending: Medical | Admitting: Psychology

## 2016-08-24 DIAGNOSIS — Z79899 Other long term (current) drug therapy: Secondary | ICD-10-CM | POA: Diagnosis not present

## 2016-08-24 DIAGNOSIS — F419 Anxiety disorder, unspecified: Secondary | ICD-10-CM | POA: Diagnosis not present

## 2016-08-24 DIAGNOSIS — F1020 Alcohol dependence, uncomplicated: Secondary | ICD-10-CM | POA: Diagnosis present

## 2016-08-24 DIAGNOSIS — F1220 Cannabis dependence, uncomplicated: Secondary | ICD-10-CM

## 2016-08-24 DIAGNOSIS — F1290 Cannabis use, unspecified, uncomplicated: Secondary | ICD-10-CM | POA: Diagnosis not present

## 2016-08-26 ENCOUNTER — Other Ambulatory Visit (HOSPITAL_COMMUNITY): Payer: 59 | Admitting: Psychology

## 2016-08-26 ENCOUNTER — Other Ambulatory Visit (HOSPITAL_COMMUNITY): Payer: Self-pay | Admitting: Medical

## 2016-08-26 DIAGNOSIS — F1220 Cannabis dependence, uncomplicated: Secondary | ICD-10-CM

## 2016-08-26 DIAGNOSIS — F1020 Alcohol dependence, uncomplicated: Secondary | ICD-10-CM

## 2016-08-26 MED ORDER — ACAMPROSATE CALCIUM 333 MG PO TBEC: 666.0000 mg | DELAYED_RELEASE_TABLET | Freq: Three times a day (TID) | ORAL | 2 refills | Status: AC

## 2016-08-26 MED ORDER — NALTREXONE HCL 50 MG PO TABS: 50.0000 mg | ORAL_TABLET | Freq: Every day | ORAL | 2 refills | Status: AC

## 2016-08-27 ENCOUNTER — Other Ambulatory Visit (HOSPITAL_COMMUNITY): Payer: 59 | Attending: Medical | Admitting: Psychology

## 2016-08-27 ENCOUNTER — Encounter (HOSPITAL_COMMUNITY): Payer: Self-pay | Admitting: Psychology

## 2016-08-27 DIAGNOSIS — F1914 Other psychoactive substance abuse with psychoactive substance-induced mood disorder: Secondary | ICD-10-CM | POA: Diagnosis not present

## 2016-08-27 DIAGNOSIS — F1994 Other psychoactive substance use, unspecified with psychoactive substance-induced mood disorder: Secondary | ICD-10-CM

## 2016-08-27 DIAGNOSIS — F1020 Alcohol dependence, uncomplicated: Secondary | ICD-10-CM

## 2016-08-27 NOTE — Progress Notes (Signed)
    Daily Group Progress Note  Program: CD-IOP   08/27/2016 Manuel Thomas [id number]  Diagnosis: Opioid dependence  Sobriety Date: 11/5  Group Time: 1-2:30 pm  Participation Level: Active  Behavioral Response: Appropriate and Sharing  Type of Therapy: Process Group  Interventions: Supportive  Topic: Process: The first half f group was spent in process. Members shared about the past weekend and any struggles or challenges they had faced in early recovery. They also identified the various ways they had supported and strengthened their recovery. During group today, three group members met with the program director, including the newest member. Four drug tests were collected today.   Group Time: 2:30-4 pm  Participation Level: Active  Behavioral Response: Sharing  Type of Therapy: Psycho-education Group  Interventions: Solution Focused  Topic: Psycho-Ed: The Wheel of Life. The second half of group was spent in a psycho-ed. A handout was provided that included 'Assessing Your Life Balance". Members colored in the six different sections and identified how satisfied they were in each category. These categories included Physical, financial, intellectual, emotional, social and spiritual. Members were active and engaged in sharing their findings, including identifying where they felt strongest and in what area they might want to improve upon.  Summary: The patient was attentive and engaged in group today. 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 The patient reported he enjoyed being back and the intensity and pleasure of his work. He reported he had been stopped by the police on his way home late Saturday night. He realized he had left his wallet at work and explained this to the male Engineer, structural. He had been speeding a few miles over the limit and this was why she  had stopped him. The patient admitted he was amazed, but she had let him go without a ticket. He couldn't believe it and admitted that it been a few months earlier, he would have been drunk and arrested. The patient attended 3 AA meetings over the weekend and really enjoyed the 'Young People's" meeting. A few men who completed this program attend that meeting and the patient stated that they asked him to say 'hello' to the counselors. During the psycho-ed, the patient identified strengthening his 'spiritual' self as the area he would like to improve upon. He also noted that perhaps he would be more conscious of sleep hygiene and make an effort to get off the computer and TV earlier before he goes to bed. The patient made some good comments and provided helpful feedback to his fellow group members. He displays good insight and is becoming a respected leader of this group. He responded well to this intervention and is making measurable progress in early recovery.   UDS collected: Yes Results: negative  AA/NA attended?: YesThursday, Friday and Sunday  Sponsor?: No   [name], LCAS 08/27/2016 10:16 AM

## 2016-08-28 ENCOUNTER — Encounter (HOSPITAL_COMMUNITY): Payer: Self-pay | Admitting: Psychology

## 2016-08-28 NOTE — Progress Notes (Signed)
    Daily Group Progress Note  Program: CD-IOP   08/28/2016 Manuel Thomas [id number]  Diagnosis:  Alcohol use disorder, severe, dependence ([place])  Substance induced mood disorder ([place])   Sobriety Date: 07/26/16   Group Time: 1-2:30  Participation Level: Active  Behavioral Response: Appropriate and Sharing  Type of Therapy: Process Group  Interventions: CBT  Topic: Process Counselors met with patients for 1.5 hour group process session. Patients were active and engaged in session. Some members were absent due to illness and alternative appointments. Counselor focused discussion on patients' goals in tx including sobriety and social support. Patients offered helpful feedback to each other and communicated clearly about their needs and hopes for group.      Group Time: 2:30-4  Participation Level: Active  Behavioral Response: Appropriate and Sharing  Type of Therapy: Psycho-education Group  Interventions: Systems analyst  Topic: Counselors met with patients for 1.5 hour group process session to discuss "interpersonal effectiveness" from a DBT skills workbook. Additionally, patients practiced listening skills with each other in dyads. Counselor led discussion on listening activity and any lessons learned. Patients brainstormed a list of "good listening habits". Counselor spent final 20 min of group talking about recognizing addictive behaviors, thoughts, and feelings.   Summary: Patient was active and engaged in group. He presented with bright, upbeat affect, clear eyes and focused listening skills during check-in and psychoed. Patient displays good knowledge of 12 step fellowship and a desire to continue progress he has made thus far. Patient stated he attended 1 AA meeting since last group and picked up his 30 day chip. Patient states no significant cravings or side effects from medication and his sleep is good. 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 Patient displays humble confidence and honesty, ability to manage recovery with work and re engagement with family members. Patient shared he is playing his drum kit for the first time "in years". Manuel Thomas, LPCA   UDS collected: No Results: negative  AA/NA attended?: YesThursday  Sponsor?: No   Manuel Thomas, LCAS 08/28/2016 10:24 AM

## 2016-08-28 NOTE — Progress Notes (Signed)
    Daily Group Progress Note  Program: CD-IOP   08/28/2016 KEMONI ORTEGA [id number]  Diagnosis:  No diagnosis found.   Sobriety Date: 11/5  Group Time: 1-2:30 pm  Participation Level: Active  Behavioral Response: Appropriate and Sharing  Type of Therapy: Process Group  Interventions: Supportive  Topic: Process: Counselors met with patients to discuss the activities they had engaged in to support their recovery. A new group member introduced himself to the group and shared his reasons for pursuing treatment. All members were active and engaged in the discussion concerning recovery from mind-altering drugs and alcohol.   Group Time: 2:30-4 pm  Participation Level: Active  Behavioral Response: Sharing  Type of Therapy: Psycho-education Group  Interventions: Strength-based  Topic: Psychoeducation: Counselors used a DBT worksheet on interpersonal effectiveness to address patient's internalized myths that impede their recovery. Group members were asked to read faulty statements and create their own challenges to them. Counselors checked in regarding goals from the previous session. Several group members met with the Investment banker, operational. A drug test was collected from one group member.   Summary: The patient presented as active and engaged in group. 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 This schedule shift has made it challenging for him to attend AA meetings. He shared that he has decided he needed other things to do than just go to meetings and the gym and he has brought his drum set to his apartment as a reward of sorts. Playing the drums acts as a replacement behavior for his use and it represents a way to get rid of excessive tension. The patient reported he is meeting more men in recovery and developing more powerful connections with others also in recovery. During the psycho-ed he  shared that he often wants to drink as a form of "rebellion', but now recognizes that there is nothing to rebel against except his own self. The patient attended one [place] meeting since we last met.he is making good progress in early recovery and provided good feedback to his fellow group members.  UDS collected: No Results:   AA/NA attended?: YesMonday  Sponsor?: No   [name], LCAS 08/28/2016 1:31 PM

## 2016-08-31 ENCOUNTER — Other Ambulatory Visit (HOSPITAL_COMMUNITY): Payer: 59 | Admitting: Psychology

## 2016-08-31 DIAGNOSIS — F1220 Cannabis dependence, uncomplicated: Secondary | ICD-10-CM

## 2016-08-31 DIAGNOSIS — F1020 Alcohol dependence, uncomplicated: Secondary | ICD-10-CM | POA: Diagnosis not present

## 2016-09-01 ENCOUNTER — Encounter (HOSPITAL_COMMUNITY): Payer: Self-pay | Admitting: Psychology

## 2016-09-01 ENCOUNTER — Encounter (HOSPITAL_COMMUNITY): Payer: 59

## 2016-09-01 DIAGNOSIS — F1020 Alcohol dependence, uncomplicated: Secondary | ICD-10-CM

## 2016-09-01 NOTE — Progress Notes (Signed)
    Daily Group Progress Note  Program: CD-IOP   09/01/2016 ZACCARY CREECH [id number]  Diagnosis:  No diagnosis found.   Sobriety Date: 11/5  Group Time: 1-2:30 pm  Participation Level: Active  Behavioral Response: Appropriate and Sharing  Type of Therapy: Process Group  Interventions: Supportive  Topic: Process: The first half of group was spent in process. Members shared about the past weekend and what they had done to support their sobriety. They were also asked to identify any challenges, temptations or even successes that they experienced since we last met. Drug tests were collected from all present. The program director met with one of the group members for a medication check.   Group Time: 2:30-4pm  Participation Level: Active  Behavioral Response: Sharing  Type of Therapy: Psycho-education Group  Interventions: Strength-based  Topic: Psycho-Ed: The Disease Model of Addiction. The second half of group was spent in a psycho-ed on the disease model of addiction. The four elements, or Bio-Psycho-Social- Spiritual aspects of addiction were discussed.  A slide show was provided on the neurobiology of addiction. The conversation was focused on understanding the biological nature of addiction. Members shared that it was helpful to know that their addiction wasn't the results of some inadequacy on their part, but rather a combination of things, of which they had little awareness.   Summary: The patient reported a busy weekend with a lot of time spent working. Despite his schedule, he had attended 2 AA meetings. They had been powerful and helpful for him. He laughed as he described an earlier dream about what an [place] meeting would be like. It included a table in the center of a large room with just a few people sitting around it. One of the meetings he attended this weekend was a perfect replica of that earlier vision. The patient reported they had many special events at the  [employer] and everything had gone extremely well with many positive comments by the members. His boss had expressed his gratitude towards him and the patient agreed that it is very satisfying to receive that validation from his superiors. When asked about his drums, the patient reported he had played them once over the weekend, but the upstairs tenant had been home for most of the time when he was home and it had limited his opportunities. In the psycho-ed, the patient noted that he had attended church with his family consistently when he was young, but he could clearly identify when that changed, and it corresponded quite clearly with his increased alcohol and drug use. He was also able to identify where the four elements of addiction all manifested in his own life. The patient provided helpful feedback and continues to follow a conscious and active daily recovery plan. He responded well to this intervention.   UDS collected: Yes Results: pending  AA/NA attended?: YesFriday and Saturday  Sponsor?: No   [name], LCAS 09/01/2016 8:10 AM

## 2016-09-01 NOTE — Progress Notes (Signed)
ELMAN DETTMAN is a 25 y.o. male patient. CD-IOP INDIVIDUAL COUNSELING SESSION: Counselor met with patient for 50 minute individual session. Patient and counselor discussed patient's past week at work, his exhaustion level, and his recent cravings for marijuana. Patient stated he was feeling overall positive but noticed that he had recently been thinking about smoking marijuana when he was home alone after working late. Patient seemed open and genuinely interested and expressed he was "worried" he would start using marijuana again after leaving the CD-IOP program. Patient stated the drug tests help him commit to sobriety. Counselor introduced idea of exploring healthy alternative ways of rewarding himself after hard work. Patient agreed and stated he would begin pursuing other hobbies and meaningful activities when alone. Patient and counselor then discussed patient's ex girlfriend and the resentment that patient feels towards her still. Patient stated he is confused and hurt that she moved on to another partner so quickly and she lied to him about having feelings for someone else. Counselor helped patient to deepen his experience of his anger and hurt by conducting an empty chair technique allowing patient to verbalize unexpressed emotions to his ex girlfriend. Patient was mildly compliant but was hesitant to explore empty chair. Cousnelor then instructed patient to write a resentment letter addressed to his ex girlfriend during the coming week and bring it to session to discuss next week. Patient was agreeable and said he was interested to write the letter. Patient stated his medications and sleep were good. [name], LPCA       [name], LCAS

## 2016-09-02 ENCOUNTER — Other Ambulatory Visit (HOSPITAL_COMMUNITY): Payer: 59 | Admitting: Psychology

## 2016-09-02 DIAGNOSIS — F1020 Alcohol dependence, uncomplicated: Secondary | ICD-10-CM | POA: Diagnosis not present

## 2016-09-02 DIAGNOSIS — F1220 Cannabis dependence, uncomplicated: Secondary | ICD-10-CM

## 2016-09-03 ENCOUNTER — Other Ambulatory Visit (HOSPITAL_COMMUNITY): Payer: 59 | Admitting: Psychology

## 2016-09-03 DIAGNOSIS — F1020 Alcohol dependence, uncomplicated: Secondary | ICD-10-CM | POA: Diagnosis not present

## 2016-09-03 DIAGNOSIS — F1994 Other psychoactive substance use, unspecified with psychoactive substance-induced mood disorder: Secondary | ICD-10-CM

## 2016-09-07 ENCOUNTER — Other Ambulatory Visit (HOSPITAL_COMMUNITY): Payer: 59 | Admitting: Psychology

## 2016-09-07 DIAGNOSIS — F1020 Alcohol dependence, uncomplicated: Secondary | ICD-10-CM

## 2016-09-07 DIAGNOSIS — F1220 Cannabis dependence, uncomplicated: Secondary | ICD-10-CM

## 2016-09-07 NOTE — Progress Notes (Signed)
    Daily Group Progress Note  Program: CD-IOP   09/07/2016 JAVIONE GUNAWAN [id number]  Diagnosis:  Alcohol use disorder, severe, dependence ([place])  Substance induced mood disorder ([place])   Sobriety Date: 08/05/16  Group Time: 1-2:30  Participation Level: Active  Behavioral Response: Appropriate and Sharing  Type of Therapy: Process Group  Interventions: Solution Focused  Topic: Counselor met with patients for group process session with an emphasis on recovery from mind-altering drugs and alcohol. Patients were active and engaged and discussed their attempts at mindfulness, coping with triggers, and interpersonal conflicts over the break.      Group Time: 2:30-4  Participation Level: Active  Behavioral Response: Appropriate and Sharing  Type of Therapy: Psycho-education Group  Interventions: CBT  Topic: Counselors met with patients for group psychoeducation session with a discussion on challenging negative thinking. Counselor utilized a Set designer from [employer]" on Recovery Thoughts and helping patients challenge the myths they tell themselves in early recovery. Patients were responsive and engaged during group.    Summary: Patient was active and engaged in group discussion and participated with an upbeat affect and presented with neat/clean grooming and clothing. Patient stated he had attended 1 AA meeting since last group. He stated he practiced gratitude by buying presents for family members who he loves. Patient stated he considered writing a letter that he and his counselor discussed in individual. He plans to write a letter to his ex girlfriend expressing his embarrassment and shame about the break up. Patient appeared motivated and verbalized confident ability to manage multiple areas of his life including work, family, and recovery. Patient has yet to secure a sponsor and verbalized a desire to pursue one before leaving CD-IOP   UDS collected: No Results:  negative  AA/NA attended?: YesWednesday  Sponsor?: No   [name], [place] 09/07/2016 4:47 PM

## 2016-09-08 ENCOUNTER — Encounter (HOSPITAL_COMMUNITY): Payer: Self-pay | Admitting: Psychology

## 2016-09-08 NOTE — Progress Notes (Signed)
    Daily Group Progress Note  Program: CD-IOP   09/08/2016 LORD LANCOUR [id number]  Diagnosis:  No diagnosis found.   Sobriety Date: 11/5  Group Time: 1-2:30 pm  Participation Level: Active  Behavioral Response: Appropriate and Sharing  Type of Therapy: Process Group  Interventions: Supportive  Topic: Process: Counselors met with patients to discuss the activities they had engaged in to support their recovery. A new group member introduced herself to the group and shared her reasons for pursuing treatment. All members were engaged in the discussion concerning recovery from mind-altering drugs and alcohol.  Group Time: 2:30- 4pm  Participation Level: Active  Behavioral Response: Appropriate  Type of Therapy: Psycho-education Group  Interventions: Strength-based  Topic: Psychoeducation: A chaplain associated with [facility] led a discussion regarding spirituality in recovery. Counselors and patients described what spirituality looked like to them and how it related to their sobriety. Patients filled out their PHQ-9 and GAD. Several patients met with the program director to discuss medication management and intake procedures. Drug tests were collected from several members.   Summary: The patient presented as active and engaged in group. He shared with the group about a frustrating work situation that he found himself in. However, he had attended a speaker meeting and felt very connected to what was being shared. It helped him let go of his frustration. In his individual session he shared that he was feeling "resentment" toward his ex-girlfriend who broke up with him due to his use. He has begun working on writing a letter to her. He does not intent to mail it, but to use this exercise as a vehicle to get his feelings out in the open. During the psycho-ed, the patient shared that he most experiences spirituality when he is cooking or out in nature. He agreed that while he is  cooking, he is fully present and not thinking about other things. Engaging in these practices brings him a great feeling of satisfaction. The patient attended 1 AA meeting since we last met. He responded well to this intervention and is making progress in early recovery. The collected scores were PHQ-9: 1 GAD: 2   UDS collected: No Results:   AA/NA attended?: YesThursday  Sponsor?: No   [name], [place] 09/08/2016 3:24 PM

## 2016-09-09 ENCOUNTER — Encounter (HOSPITAL_COMMUNITY): Payer: Self-pay | Admitting: Medical

## 2016-09-09 ENCOUNTER — Other Ambulatory Visit (INDEPENDENT_AMBULATORY_CARE_PROVIDER_SITE_OTHER): Payer: 59 | Admitting: Psychology

## 2016-09-09 DIAGNOSIS — F1220 Cannabis dependence, uncomplicated: Secondary | ICD-10-CM | POA: Diagnosis not present

## 2016-09-09 DIAGNOSIS — F1020 Alcohol dependence, uncomplicated: Secondary | ICD-10-CM

## 2016-09-09 DIAGNOSIS — F411 Generalized anxiety disorder: Secondary | ICD-10-CM | POA: Diagnosis not present

## 2016-09-09 DIAGNOSIS — R0989 Other specified symptoms and signs involving the circulatory and respiratory systems: Secondary | ICD-10-CM

## 2016-09-09 DIAGNOSIS — F10280 Alcohol dependence with alcohol-induced anxiety disorder: Secondary | ICD-10-CM | POA: Diagnosis not present

## 2016-09-09 DIAGNOSIS — F1994 Other psychoactive substance use, unspecified with psychoactive substance-induced mood disorder: Secondary | ICD-10-CM | POA: Diagnosis not present

## 2016-09-09 DIAGNOSIS — I10 Essential (primary) hypertension: Secondary | ICD-10-CM | POA: Diagnosis not present

## 2016-09-09 DIAGNOSIS — Z6372 Alcoholism and drug addiction in family: Secondary | ICD-10-CM | POA: Diagnosis not present

## 2016-09-09 DIAGNOSIS — K7010 Alcoholic hepatitis without ascites: Secondary | ICD-10-CM

## 2016-09-09 DIAGNOSIS — Z811 Family history of alcohol abuse and dependence: Secondary | ICD-10-CM

## 2016-09-09 NOTE — Progress Notes (Addendum)
  [facility] Summary   Manuel Thomas [id number]  Date of Admission: 07/27/2016 Date of Discharge: 09/16/2016  Course of Treatment: Pt entered into CD IOP 07/27/2016 with admitted alcohol and cannabis use disorders severe with dependence.He elected to take MAT for cravings  Naltrexone 50 mg and Campral 666mg  TID for cravings.He was on Zoloft 100 mg for his anxiety from Dr Trellis MomentMcKeowin PCP He has been sober since 07/26/2016 and has chosen the 12 Step recovery program as his choice for ongoing/maintenace of his sobriety.He has been exemplary in his participation and compliance with all aspects of the CD IOP program and his prognosis is excellent if he continues participation in the manner he has started here.  Goals and Activities to Help Maintain Sobriety: 1. Stay away from old friends who continue to drink and use mind-altering chemicals. 2. Continue practicing Fair Fighting rules in interpersonal conflicts. 3. Continue alcohol and drug refusal skills and call on support systems. 4.   Continue anticraving meds for 90 days then review with PCP.  Medications: Review open orders      Name Dose, Frequency              Anxiety state, Other fatigue   sertraline (ZOLOFT) 100 MG tablet(Expired)        Summary: Take 1 tablet daily - for mood, Normal    Unassociated   acamprosate (CAMPRAL) 333 MG tablet 666 mg, 3 times daily       Summary: Take 2 tablets (666 mg total) by mouth 3 (three) times daily. With meals, Starting Wed 08/26/2016, Until Fri 09/25/2016, Normal          Referrals: No new referrals indicated .FU with PCP   Aftercare services: Tuesdays 5:30 [facility] 1. Attend AA meetings as often as needed.Suggest at least 3-4 meetings weekly for next year 2. Continue with a sponsor and a home group in AA 3. Return to Dr Trellis MomentMcKeowin MD PCP 4.  Medications: Anxiety state, Other fatigue   sertraline (ZOLOFT) 100 MG  tablet(Expired)        Summary: Take 1 tablet daily - for mood, Normal    Unassociated   acamprosate (CAMPRAL) 333 MG tablet 666 mg, 3 times daily       Summary: Take 2 tablets (666 mg total) by mouth 3 (three) times daily. With meals, Starting Wed 08/26/2016, Until Fri 09/25/2016, Normal      naltrexone (DEPADE) 50 MG tablet 50 mg, Daily       Summary: Take 1 tablet (50 mg total) by mouth daily., Starting Wed 08/26/2016, Until Fri 09/25/2016, Normal       Next appointment: as scheduled with Dr Trellis MomentMcKeowin  Plan of Action to Address Continuing Problems: As above    Client has participated in the development of this discharge plan and has received a copy of this completed plan  Manuel Thomas  09/09/2016   Manuel Mornharles Jaleia Hanke, PA-C 09/09/2016

## 2016-09-10 ENCOUNTER — Other Ambulatory Visit (HOSPITAL_COMMUNITY): Payer: 59 | Admitting: Psychology

## 2016-09-10 DIAGNOSIS — F1220 Cannabis dependence, uncomplicated: Secondary | ICD-10-CM

## 2016-09-10 DIAGNOSIS — F1020 Alcohol dependence, uncomplicated: Secondary | ICD-10-CM | POA: Diagnosis not present

## 2016-09-11 NOTE — Progress Notes (Signed)
    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 Pts were active and engaged in discussion. 1 new group member was present and shared briefly and offered feedback to others. One group member became markedly tearful when discussing her recent alcoholism. UDS were collected from some members. Some members met with Darlyne Russian, program director.     Group Time: 2:30-4  Participation Level: Active  Behavioral Response: Appropriate and Sharing  Type of Therapy: Psycho-education Group  Interventions: Strength-based  Topic: Met with patients for 1.5 hour group psychoeducation session with an emphasis on AA, 12 step knowledge, and how to pursue early recovery. Patients were all active and engaged in session. Counselor offered strategies for relapse prevention, calling friends, and reaching out for help at meetings.   Summary: Patient was active and engaged in group. He stated he attended 1 AA meeting since last group and was staying busy at work and experiencing minor stress from the holidays and working. Patient appears confident, insightful, and open to feedback in group. He discussed securing a sponsor before graduating from CD-IOP. He met with program director  to discuss upcoming discharge from program next week. See attached note from Darlyne Russian, [place]. [name], LPCA   UDS collected: No Results: negative  AA/NA attended?: YesThursday  Sponsor?: No   [name], [place] 09/11/2016 11:38 AM

## 2016-09-16 ENCOUNTER — Other Ambulatory Visit (HOSPITAL_COMMUNITY): Payer: 59 | Admitting: Psychology

## 2016-09-16 ENCOUNTER — Encounter (HOSPITAL_COMMUNITY): Payer: Self-pay | Admitting: Psychology

## 2016-09-16 DIAGNOSIS — F1020 Alcohol dependence, uncomplicated: Secondary | ICD-10-CM | POA: Diagnosis not present

## 2016-09-16 DIAGNOSIS — F1994 Other psychoactive substance use, unspecified with psychoactive substance-induced mood disorder: Secondary | ICD-10-CM

## 2016-09-16 NOTE — Progress Notes (Signed)
    Daily Group Progress Note  Program: CD-IOP   09/16/2016 Celine AhrJacob S Burks [id number]  Diagnosis:  No diagnosis found.   Sobriety Date: 11/5  Group Time: 1-2:30 pm  Participation Level: Active  Behavioral Response: Sharing  Type of Therapy: Process Group  Interventions: Supportive  Topic: Process: A former member appeared today and said his 'good-byes' to his fellow group members. This counselor has asked him to come and demonstrate what a healthy farewell looks like. The patient shared about his new job and his continued commitment to sobriety. He urged group members to make the most of their time here in the program. After he left, the members engaged in a 'process' session. They shared about current issues and challenges in early recovery. One member disclosed the differences with her husband who is currently at their home in [place]. she is here with her parents and their 5378-month-old son. While both are in recovery, he seems to be in a considerably different place, in terms of "recovery mentality". The group provided helpful feedback and the importance of her sharing and 'venting' was encouraged and validated. During this half of group, members shared about their plans for the upcoming Christmas holiday.   Group Time: 2:30-4 pm  Participation Level: Active  Behavioral Response: Sharing  Type of Therapy: Psycho-education Group  Interventions: Solution Focused  Topic: Psycho-Ed: "Most Common Reasons for Relapse". The second half of group was spent in a psycho-ed on the four most common reasons for relapse. A handout was provided, and the group read the handout together and discussed these four common reasons. Members identified the reasons they might be most tempted to use and/or relapse. The session emphasized identifying strategies/plans to address these reasons when they appear. Three group members were absent today.  Summary: The patient reported he had attended  an AA meeting last night and the topic had been Step 3, which he said was perfect since, that is the Step he is working on right now. he had worked this morning and would go back after this group session. The patient reported he had completed his Christmas shopping and is feeling very good about his gifts. In the psycho-ed, the patient identified the euphoria of his addiction as being the most likely relapse trigger. He identified how he would address this should it come up, which he noted, it already has. The warmth of the bourbon down his throat is the romance. Despite this powerful memory, the patient reported he has devised strategies with his counselor and in AA meetings to answer this temptation and allure of the disease. The patient provided good feedback and responded well to this intervention. He has maintained sobriety since prior to entering this program and he will be graduating next week.    UDS collected: No Results:   AA/NA attended?: YesWednesday  Sponsor?: No   Charmian Muffnn Jennylee Uehara, LCAS 09/16/2016 9:51 AM

## 2016-09-16 NOTE — Addendum Note (Signed)
Addended by: Court JoyKOBER, Connar Keating E on: 09/16/2016 02:39 PM   Modules accepted: Orders

## 2016-09-17 ENCOUNTER — Other Ambulatory Visit (HOSPITAL_COMMUNITY): Payer: 59

## 2016-09-17 ENCOUNTER — Encounter (HOSPITAL_COMMUNITY): Payer: Self-pay | Admitting: Psychology

## 2016-09-17 NOTE — Progress Notes (Signed)
    Daily Group Progress Note  Program: CD-IOP   09/17/2016 Manuel Thomas [id number]  Diagnosis:  Alcohol use disorder, severe, dependence ([place])  Substance induced mood disorder ([place])   Sobriety Date: 07/26/16  Group Time: 1-2:30  Participation Level: Active  Behavioral Response: Appropriate and Sharing  Type of Therapy: Process Group  Interventions: Strength-based and Supportive  Topic: Patients discussed their recovery from mind-altering drugs and alcohol and the challenges, successes, and events related to tx. One new pt was present. Two pts met with Darlyne Russian, PA for a discharge and entry into the program (see note). Patients shared about their holiday break and stressors from family gatherings. Some members had new sobriety dates. One member admitted to lying to the group in previous sessions and was confronted by group members about his motivation and future status in the group.     Group Time: 2:30-4  Participation Level: Active  Behavioral Response: Appropriate and Sharing  Type of Therapy: Psycho-education Group  Interventions: Solution Focused  Topic: Counselor led discussion on honesty and seeking sobriety during difficult times such as holidays. Patients were active and engaged in discussion. Counselor led a graduation ceremony for one member who successfully discharged today.    Summary: Patient was active and engaged for his final group session. He graduated successfully at the end of session and his two parents were present for final 15 min of session in support. Patient met with program director to discuss final plans for discharge and medication. Patient was mostly quiet and reserved for his final session. He smiled frequently and seemed to appreciate the praise, feedback, and support he got from the group. Patient stated he enjoyed the ability to "go deeper" with counselors and the group to help resolve his grief and other negative feelings. See  attached note from Darlyne Russian. Manuel Thomas, LPCA   UDS collected: No Results: negative  AA/NA attended?: YesWednesday  Sponsor?: No   [name], LCAS 09/17/2016 4:54 PM

## 2016-09-17 NOTE — Progress Notes (Signed)
    Daily Group Progress Note  Program: CD-IOP   09/17/2016 Manuel Thomas [id number]  Diagnosis:  No diagnosis found.   Sobriety Date: 11/5  Group Time: 1-2:30 pm  Participation Level: Active  Behavioral Response: Appropriate and Sharing  Type of Therapy: Process Group  Interventions: Supportive  Topic: Process: the first half of group was spent in process. Members shared about the past weekend and any challenges or temptations they may have faced. They are also asked to identify what they did to enhance or strengthen their sobriety, including attending 12-step meetings, speaking with others in recovery, working with a sponsor or addressing step work. A new group member was present, and she met with the program director today. He also completed med checks with other group members. Random drug tests were collected today.   Group Time: 2:30-4 pm  Participation Level: Active  Behavioral Response: Sharing  Type of Therapy: Psycho-education Group  Interventions: Strength-based  Topic: Guided Meditation/Psycho-Ed: PAWS; the second half of group began with a 5 minute guided meditation led by one of the counselors. At the conclusion, group members seemed to agree that it had proven very relaxing. A handout was provided to members on the topic of PAWS or post-acute withdrawal syndrome. Members took turns reading the handout and then shared their own experiences with some of these very common symptoms of early recovery.  There was good conversation and feedback among group members.   Summary: The patient reported he had been busy working this weekend. 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 Despite this stressful situation, the patient remarked on how calm he remained while others were more frantic as they worked in [employer]. The patient reported that he finds his work very 'rewarding'. He attended two [place] meetings since we last met and enjoyed  both of them. The patient identified with some of the symptoms of PAWS identified on the handout discussed in the psycho-ed. Although, he is feeling much better since he has been sober, he has done things to enhance his balance, including working out, eating more consistently and taking better care of himself. He provided some helpful feedback and responded well to this intervention. His time here in the program is about to end as he prepares for graduation next week. The patient has made significant progress in early recovery and been an active group member.   UDS collected: Yes Results: negative  AA/NA attended?: YesThursday and Saturday  Sponsor?: No   [name], LCAS 09/17/2016 7:39 AM

## 2016-09-18 NOTE — Discharge Summary (Signed)
CD-IOP Discharge Summary  Manuel AhrJacob S Thomas    Nov 17, 1990 [id number]   Diagnosis:Alcohol use disorder, severe, dependence (HCC)  Substance induced mood disorder [facility](HCC)  Admission Date: 07/27/16 Discharge Date:  09/16/16  Reason for Admission:  "Unable to maintain alcohol and marijuana dependent lifestyle" "stress from over-working" "Family hx of alcoholism" "Loss of relationship due to alcoholism" "Substance induced mood disorder" "Loss of passion and interest"  Chemical Use History: Patient reported using alcohol and marijuana socially "for fun" at age 25, Oxycontin at age 25 as px for pain, and most recently Xanax (5mg  daily) for past 2 months for anxiety. Patient stated his alcohol and marijuana use began to escalate in the last year when he began to drink whiskey in the morning and throughout the day at work. Patient stated he would smoke marijuana daily "to help sleep". He has previous attempts at trying to "cut down" and his longest period of sobriety is around 2-3 weeks. Pt reported familial link to alcohol addiction and mental illness on both paternal and maternal sides. Patient recently split up with his girlfriend of 3 years and stated that alcohol "played a major part in their separation".   Family of Origin Issues: No hx of traumatic experiences in childhood. Reports father abused alcohol but stopped drinking in 1989. Supportive parents who live close by and older brother who lives in [place]. Recent loss of grandparents and states he has "unresolved grief" since he was very close to them.   Treatment Progress: Patient made good progress during his tx. He attended at least 4 AA meetings per week by week 2 of tx. He was compliant in all aspects of his tx plan, MAT, and sharing in group therapy. Patient made significant progress in individual sessions where he tended to share more intimately with counselor. Patient began working full time for final 2 weeks of tx and seemed well-adjusted and  committed to sobriety and maintaining a recovery-focused lifestyle. Other patients stated he became a "group leader" through his steadiness and commitment to recovery.    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  Note by Dorann LodgeWes Swan, LPCA  Charmian MuffAnn Mckinsley Koelzer LCAS

## 2016-09-23 ENCOUNTER — Other Ambulatory Visit (HOSPITAL_COMMUNITY): Payer: 59

## 2016-10-06 ENCOUNTER — Other Ambulatory Visit: Payer: Self-pay | Admitting: Internal Medicine

## 2016-10-06 DIAGNOSIS — F411 Generalized anxiety disorder: Secondary | ICD-10-CM

## 2016-10-06 DIAGNOSIS — R5383 Other fatigue: Secondary | ICD-10-CM

## 2016-10-12 ENCOUNTER — Encounter (HOSPITAL_COMMUNITY): Payer: Self-pay | Admitting: Licensed Clinical Social Worker

## 2016-10-12 ENCOUNTER — Other Ambulatory Visit (HOSPITAL_COMMUNITY): Payer: 59 | Attending: Psychiatry | Admitting: Licensed Clinical Social Worker

## 2016-10-12 DIAGNOSIS — F1021 Alcohol dependence, in remission: Secondary | ICD-10-CM | POA: Diagnosis not present

## 2016-10-12 NOTE — Progress Notes (Signed)
AFTERCARE GROUP Patient was present, active, and engaged for 60 min aftercare group ffrom 5:30-6:30pm. Counselor discussed patient's experience in early recovery. He reported he was feeling very optimistic and hopeful about his sobriety. He continues to avoid distressing events and behaviors and admits he is experiencing more positive emotions. Patient and counselor discussed ideas to help facilitate a better group experience. Patient stated his sleep, medications, and thoughts were all manageable and he was still going to 5 AA meetings per week.

## 2016-10-14 ENCOUNTER — Other Ambulatory Visit (HOSPITAL_BASED_OUTPATIENT_CLINIC_OR_DEPARTMENT_OTHER): Payer: 59 | Admitting: Licensed Clinical Social Worker

## 2016-10-14 DIAGNOSIS — F1021 Alcohol dependence, in remission: Secondary | ICD-10-CM

## 2016-10-15 NOTE — Progress Notes (Signed)
CO-OCCURRING AFTERCARE GROUP 5:30-6:30pm  Patient presented as euthymic, relaxed, and shared openly about his previous 48 hours. He stated he had been relaxing from work and enjoying time with his dad. He continued to attend AA and played his drums. Counselor inquired about patient's hopes for the new year. Pt stated he imagines getting a job at [employer] he loves, moving to bigger city, and possibly getting into a new relationship. Pt stated he worries about being "so far from his family" if he moves to a city a few hours away. Counselor reflected that he feels obligated to stay near his family "in case something happens" which he agreed with. Patient appeared stable and committed to his recovery. He offered helpful feedback to a member who was struggling with whether or not to stop smoking marijuana. Pt shared his experience of feeling happy that he can do ADL's w/o marijuana or alcohol.

## 2016-10-19 ENCOUNTER — Ambulatory Visit (INDEPENDENT_AMBULATORY_CARE_PROVIDER_SITE_OTHER): Payer: 59 | Admitting: Licensed Clinical Social Worker

## 2016-10-19 DIAGNOSIS — F1021 Alcohol dependence, in remission: Secondary | ICD-10-CM | POA: Diagnosis not present

## 2016-10-20 NOTE — Progress Notes (Signed)
   THERAPIST PROGRESS NOTE  Session Time: 5:30-6:30pm  Participation Level: Active  Behavioral Response: Neat and Well GroomedAlertEuthymic  Type of Therapy: Individual Therapy  Treatment Goals addressed: Coping  Interventions: Solution Focused and Assertiveness Training  Summary: Manuel Thomas is a 26 y.o. male who presents with Alcohol Use Disorder Severe in early remission. He continues to work a Investment banker, corporateprogram of total sobriety utilizing 12 step fellowship, friends, and healthy work environment to support himself. Patient stated he has secured a sponsor and hopes to meet with him weekly to begin step work. Patient and counselor discussed how patient is working to sustain his recovery and what could potentially lead him back to using marijuana. Pt reported he has no desire to drink again but does fantasize about using marijuana. Pt reported he may be receiving a promotion at work since his boss asked him if he was open to more responsibility which made pt excited/happy. Patient stated his sleep and medications are all stable and working effectively. Patient reported he has begun "seeing" a new girl who is also in "a form of recovery". He admitted her sobriety was not as strong as his but he stated he feels safe with her and open about sharing about his addiction. Patient stated he enjoyed group despite being the only one in attendance and stated he would return Wednesday evening. His sobriety date remains 07/26/16.  Suicidal/Homicidal: Nowithout intent/plan  Therapist Response: Counselor noticed patients strong eye contact, positive attitude, and consistent smile throughout session. Pt admitted he sometimes feels resentful towards his ex girlfriend and he wants her "to know he's doing well so she can feel bad". Counselor and pt discussed feelings of resentment and how to discharge negative emotions w/o risking relapse.   Plan: Return again this week.  Diagnosis: Alcohol Abuse and Substance  Abuse        Manuel Thomas, LPCA 10/20/2016

## 2016-10-21 ENCOUNTER — Encounter (HOSPITAL_COMMUNITY): Payer: Self-pay | Admitting: Licensed Clinical Social Worker

## 2016-10-21 ENCOUNTER — Ambulatory Visit (INDEPENDENT_AMBULATORY_CARE_PROVIDER_SITE_OTHER): Payer: 59 | Admitting: Licensed Clinical Social Worker

## 2016-10-21 DIAGNOSIS — F1021 Alcohol dependence, in remission: Secondary | ICD-10-CM

## 2016-10-21 NOTE — Progress Notes (Signed)
   THERAPIST PROGRESS NOTE  Session Time: 5:30-5:40pm  Participation Level: Active  Behavioral Response: Well GroomedAlertEuthymic  Type of Therapy: Aftercare  Treatment Goals addressed: Coping  Interventions: CBT, Solution Focused and Supportive  Summary: Manuel Thomas is a 26 y.o. male who presents with alcohol use disorder in early remission. He continues to maintain sobriety from all mind-altering substances. He offered helpful and supportive feedback to other group member. Patient stated he is nervous about entering a new relationship in early recovery and he hopes to be honest with his sponsor about it.    Suicidal/Homicidal: Nowithout intent/plan  Therapist Response: Counselor used validation and supportive feedback to help client sustain his changes and recovery. Patient appeared happy and positive about his current state.   Plan: Return again in 1 weeks.      Margo CommonWesley E Koran Seabrook, LPCA 10/21/2016

## 2016-10-26 ENCOUNTER — Ambulatory Visit (INDEPENDENT_AMBULATORY_CARE_PROVIDER_SITE_OTHER): Payer: 59 | Admitting: Licensed Clinical Social Worker

## 2016-10-26 DIAGNOSIS — F1021 Alcohol dependence, in remission: Secondary | ICD-10-CM | POA: Diagnosis not present

## 2016-10-26 DIAGNOSIS — F1994 Other psychoactive substance use, unspecified with psychoactive substance-induced mood disorder: Secondary | ICD-10-CM

## 2016-10-27 ENCOUNTER — Encounter (HOSPITAL_COMMUNITY): Payer: Self-pay | Admitting: Licensed Clinical Social Worker

## 2016-10-27 NOTE — Progress Notes (Signed)
   THERAPIST PROGRESS NOTE  Session Time: 5:30-6:30  Participation Level: Minimal  Behavioral Response: Casual, Guarded and NeatAlertEuthymic  Type of Therapy: Group Therapy  Treatment Goals addressed: Coping  Interventions: CBT, Strength-based and Supportive  Summary: Manuel Thomas is a 26 y.o. male who presents with Alcohol dependence, over 90 days of sobriety through working a 12 step program, and continually improving depression symptoms. Patient was minimally engaged in group discussion and was open to speaking when prompted by counselor. Pt stated he recently got his 90 day chip and was feeling hopeful. He stated he was having thoughts of being "able to use marijuana safely" but knew this was concerning. Patient appeared distracted and pensive when other group member were sharing.   Suicidal/Homicidal: Nowithout intent/plan  Therapist Response: Patient appeared interested in observing the group process but was not participating actively as he has done in past sessions. He continues to verbalize commitment to his 12 step recovery program and using his relapse prevention plan he established in CD-IOP.  Plan: Return again in 1 weeks.      Manuel Thomas, LPCA 10/27/2016

## 2016-10-28 ENCOUNTER — Ambulatory Visit (INDEPENDENT_AMBULATORY_CARE_PROVIDER_SITE_OTHER): Payer: 59 | Admitting: Licensed Clinical Social Worker

## 2016-10-28 DIAGNOSIS — F1994 Other psychoactive substance use, unspecified with psychoactive substance-induced mood disorder: Secondary | ICD-10-CM

## 2016-10-28 DIAGNOSIS — F1021 Alcohol dependence, in remission: Secondary | ICD-10-CM

## 2016-10-29 ENCOUNTER — Encounter (HOSPITAL_COMMUNITY): Payer: Self-pay | Admitting: Licensed Clinical Social Worker

## 2016-10-29 NOTE — Progress Notes (Signed)
   THERAPIST PROGRESS NOTE  Session Time: 5:30-6:30pm  Participation Level: Minimal  Behavioral Response: NeatAlertEuthymic  Type of Therapy: Group Therapy  Treatment Goals addressed: Coping  Interventions: CBT and Solution Focused  Summary: VINOD MIKESELL is a 26 y.o. male who presents with flat affect and normal mood. He states he is experiencing more positive feelings. He states he met with his AA sponsor and has begun step work. He reported spending 2-3 hours with his sponsor by going to the gym, getting coffee, and then reading the big book together. He is meeting his tx goals of sustaining sobriety and early recovery. He admits he still has thoughts of wanting marijuana "when he feels like giving himself a vacation".    Suicidal/Homicidal: Nowithout intent/plan  Therapist Response: Patient appears motivated by fear of failure and "having to pick up a new chip", if he was to relapse. He admits he is "very reliable" and avoids inconveniencing others or creating difficulties for his family and friends. Patient recently secured a sponsor but admitted in group that he did not choose the sponsor actively but rather allowed the sponsor to "choose him" by asking pt if he could sponsor him. Counselor continues to monitor ambivalence about sustaining complete sobriety. Pt states work is going well and he is not having interpersonal difficulties.  Plan: Return again in 1 weeks.     Archie Balboa, LPCA 10/29/2016

## 2016-11-02 ENCOUNTER — Ambulatory Visit (INDEPENDENT_AMBULATORY_CARE_PROVIDER_SITE_OTHER): Payer: 59 | Admitting: Licensed Clinical Social Worker

## 2016-11-02 DIAGNOSIS — F1994 Other psychoactive substance use, unspecified with psychoactive substance-induced mood disorder: Secondary | ICD-10-CM | POA: Diagnosis not present

## 2016-11-02 DIAGNOSIS — F1021 Alcohol dependence, in remission: Secondary | ICD-10-CM | POA: Diagnosis not present

## 2016-11-04 ENCOUNTER — Ambulatory Visit (INDEPENDENT_AMBULATORY_CARE_PROVIDER_SITE_OTHER): Payer: 59 | Admitting: Licensed Clinical Social Worker

## 2016-11-04 ENCOUNTER — Encounter (HOSPITAL_COMMUNITY): Payer: Self-pay | Admitting: Licensed Clinical Social Worker

## 2016-11-04 DIAGNOSIS — F1021 Alcohol dependence, in remission: Secondary | ICD-10-CM

## 2016-11-04 DIAGNOSIS — F1994 Other psychoactive substance use, unspecified with psychoactive substance-induced mood disorder: Secondary | ICD-10-CM

## 2016-11-04 NOTE — Progress Notes (Signed)
   AFTERCARE CO-OCCURRING GROUP THERAPIST PROGRESS NOTE  Session Time: 5:30-6:30  Participation Level: Minimal  Behavioral Response: NeatAlertEuthymic and passive  Type of Therapy: Group Therapy  Treatment Goals addressed: Coping  Interventions: CBT and Solution Focused  Summary: Manuel Thomas is a 26 y.o. male who presents with low affect, low engagement, and few verbalizations. Counselor met with patients for 1 hour group process session. Counselor asked pt to check with how they are feeling on a 1-10 scale with 10 being great. Counselor encouraged pt to ask for space in the group tonight and share about what they felt they needed to. One patient started the session by talking about his guilt and frustration with lack of motivation and not wanting to engaged in healthy behaviors. Counselor used MI to explore ambivalence and linked pt situation with others in the group. Patients shared openly. One patient remained silent for most of session and stated he was "being a sponge". Counselor encouraged him to share what he was gaining from group.   Suicidal/Homicidal: Nowithout intent/plan  Therapist Response: Patient seemed distracted and inattentive during session. He shared at the beginning that he was approaching 100 days of sobriety. He did not speak voluntarily and only spoke when asked by other group members or counselor. He shared that he was feeling an "8" and was enjoying his days off. He shared briefly about "being a sponge" and the counselor inquired about what he was gleaning from session today. Pt stated he was gaining wisdom from others' experiences. He did not share about any cravings, thoughts, or temptations to compromise his recovery. 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   Plan: Return again in 1 weeks.      Archie Balboa, LPCA 11/04/2016

## 2016-11-05 ENCOUNTER — Encounter (HOSPITAL_COMMUNITY): Payer: Self-pay | Admitting: Licensed Clinical Social Worker
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
## 2016-11-09 ENCOUNTER — Ambulatory Visit (INDEPENDENT_AMBULATORY_CARE_PROVIDER_SITE_OTHER): Payer: 59 | Admitting: Licensed Clinical Social Worker

## 2016-11-09 DIAGNOSIS — F1021 Alcohol dependence, in remission: Secondary | ICD-10-CM | POA: Diagnosis not present

## 2016-11-09 DIAGNOSIS — F1994 Other psychoactive substance use, unspecified with psychoactive substance-induced mood disorder: Secondary | ICD-10-CM

## 2016-11-10 ENCOUNTER — Encounter (HOSPITAL_COMMUNITY): Payer: Self-pay | Admitting: Licensed Clinical Social Worker

## 2016-11-10 NOTE — Progress Notes (Signed)
   THERAPIST PROGRESS NOTE Aftercare Group  Session Time: 5:30-6:30  Participation Level: Minimal  Behavioral Response: NeatAlertEuthymic  Type of Therapy: Group Therapy  Treatment Goals addressed: Coping  Interventions: CBT and Solution Focused  Summary: Manuel Thomas is a 26 y.o. male who presents with alcohol use disorder in early remission. He states he continues to sustain his recovery by going to meetings and talking with his sponsor. Patient primarily observed in group and was able to offer feedback to other group members. He stated his medications, sleep, and mood are also very stable.    Suicidal/Homicidal: NAwithout intent/plan  Therapist Response: Patient appears to be benefitting from group as a form of accountability and states it is "keeping him honest". He does state he continues to struggle with desire to smoke marijuana for fun and when he is bored.   Plan: Return again in 1 weeks.     Margo CommonWesley E Swan, LPCA 11/10/2016

## 2016-11-11 ENCOUNTER — Ambulatory Visit (INDEPENDENT_AMBULATORY_CARE_PROVIDER_SITE_OTHER): Payer: 59 | Admitting: Licensed Clinical Social Worker

## 2016-11-11 DIAGNOSIS — F1021 Alcohol dependence, in remission: Secondary | ICD-10-CM

## 2016-11-12 ENCOUNTER — Encounter (HOSPITAL_COMMUNITY): Payer: Self-pay | Admitting: Licensed Clinical Social Worker

## 2016-11-12 NOTE — Progress Notes (Signed)
   THERAPIST PROGRESS NOTE  Session Time: 5:30-6:30pm  Participation Level: Active  Behavioral Response: NeatAlertEuthymic  Type of Therapy: Individual Therapy  Treatment Goals addressed: Anxiety and Coping  Interventions: CBT  Summary: Manuel Thomas is a 26 y.o. male who presents with flat affect and seeking help for sustaining his alcohol use disorder in early remission. Patient reports he has over 100 days of sobriety and he continues to attend 12 step meetings 4-5 times per week. He discussed his new romantic relationship and how to communicate to his g/f about his "fragile state" in early recovery. Pt was active and engaged in session and steered the conversation to lighter, less psychologically heavy topics.    Suicidal/Homicidal: NAwithout intent/plan  Therapist Response: Patient continues to exhibit positive coping skills and reports feeling healthy and well adjusted. He states he is nervous about his new romantic relationship since it could compromise his sobriety and he has a hx of becoming too emotinoally attached with other women too early in the relationship. Counselor recommended including g/f in the conversation and being honest with her about how pt feels. Pt agreed that was a good idea.  Plan: Return again in 1 weeks.     Margo CommonWesley E Shanley Furlough, LPCA 11/12/2016

## 2016-11-16 ENCOUNTER — Ambulatory Visit (HOSPITAL_COMMUNITY): Payer: 59 | Admitting: Licensed Clinical Social Worker

## 2016-11-18 ENCOUNTER — Ambulatory Visit (HOSPITAL_COMMUNITY): Payer: 59 | Admitting: Licensed Clinical Social Worker

## 2016-11-23 ENCOUNTER — Ambulatory Visit (HOSPITAL_COMMUNITY): Payer: 59 | Admitting: Licensed Clinical Social Worker

## 2016-11-25 ENCOUNTER — Encounter (HOSPITAL_COMMUNITY): Payer: Self-pay | Admitting: Licensed Clinical Social Worker

## 2016-11-25 ENCOUNTER — Ambulatory Visit (INDEPENDENT_AMBULATORY_CARE_PROVIDER_SITE_OTHER): Payer: 59 | Admitting: Licensed Clinical Social Worker

## 2016-11-25 DIAGNOSIS — F1021 Alcohol dependence, in remission: Secondary | ICD-10-CM

## 2016-11-25 DIAGNOSIS — F1994 Other psychoactive substance use, unspecified with psychoactive substance-induced mood disorder: Secondary | ICD-10-CM

## 2016-11-25 NOTE — Progress Notes (Signed)
[facility] GROUP  THERAPIST PROGRESS NOTE  Session Time: 5:30-6:30pm  Participation Level: Active  Behavioral Response: NeatAlertEuthymic  Type of Therapy: Group Therapy  Treatment Goals addressed: Coping  Interventions: CBT  Summary: Manuel AhrJacob S Thomas is a 26 y.o. male who presents with alcohol dependence in early remission and mild anxiety in work settings.   Suicidal/Homicidal: Nowithout intent/plan  Therapist Response: Patient was active and engaged in session and states he continues to maintain his sobriety, go to AA regularly, and work with his sponsor. He states that his work is going well but he wants to gain more responsibility. Counselor and pt discussed ways to "lead by example" and show his boss that he is serious about wanting more responsibility. Patient was congruent and congenial during session.  Plan: Return again in 1 weeks.      Margo CommonWesley E Swan, LPCA 11/25/2016

## 2016-11-30 ENCOUNTER — Ambulatory Visit (HOSPITAL_COMMUNITY): Payer: 59 | Admitting: Licensed Clinical Social Worker

## 2016-12-02 ENCOUNTER — Ambulatory Visit (INDEPENDENT_AMBULATORY_CARE_PROVIDER_SITE_OTHER): Payer: 59 | Admitting: Licensed Clinical Social Worker

## 2016-12-02 DIAGNOSIS — F1994 Other psychoactive substance use, unspecified with psychoactive substance-induced mood disorder: Secondary | ICD-10-CM | POA: Diagnosis not present

## 2016-12-02 DIAGNOSIS — F1021 Alcohol dependence, in remission: Secondary | ICD-10-CM

## 2016-12-03 ENCOUNTER — Encounter (HOSPITAL_COMMUNITY): Payer: Self-pay | Admitting: Licensed Clinical Social Worker

## 2016-12-03 NOTE — Progress Notes (Signed)
  [facility] AFTERCARE THERAPIST PROGRESS NOTE  Session Time: 5-6:30pm  Participation Level: Active  Behavioral Response: NeatAlertEuthymic  Type of Therapy: Group Therapy  Treatment Goals addressed: Coping  Interventions: CBT and Meditation: Mindfulness Based Relapse Prevention  Summary: Manuel Thomas is a 26 y.o. male who presents with Alcohol Use Disorder Severe in early remission and a substance induced mood disorder.   Suicidal/Homicidal: Nowithout intent/plan  Therapist Response: Patient was active and engaged in therapeutic process today. He reported his mood is still stable and he is not experiencing debilitating cravings to use alcohol or marijuana. Pt. reports he continues to meet with sponsor weekly and work 12 steps. Pt. responded well to interventions and reported enjoying group today. Counselor led Session 1 from Mindfulness Based Relapse Prevention, an EBT for Aftercare. Counselor and pts. discussed "auto pilot", developing a non-judgmental stance, and engaged in mindful eating as well as a 15 min body scan. Pts. were assigned homework to practice and document some form of mindfulness daily before next session.   Plan: Return again in 1 weeks.     Margo CommonWesley E Swan, LPCA 12/03/2016

## 2016-12-07 ENCOUNTER — Ambulatory Visit (HOSPITAL_COMMUNITY): Payer: 59 | Admitting: Licensed Clinical Social Worker

## 2016-12-07 IMAGING — CT CT WRIST*R* W/O CM
2 series · 15 of 20 positions shown, 18 images · non-contrast
Comparison: Plain films of the right wrist 11/06/2014.

CLINICAL DATA: Status post fall all 11/05/2014 with a scaphoid
fracture. Burning sensation in the right wrist. The patient is in a
cast.

EXAM:
CT OF THE RIGHT WRIST WITHOUT CONTRAST
TECHNIQUE: Multidetector CT imaging of the right wrist was performed according
to the standard protocol. Multiplanar CT image reconstructions were
also generated.

[Series 5: upper ext soft · axial · 0.29mm/px · z∈[-32,+138]mm · 12 of 82 slices shown, 15 images]
[im 7/82  soft-tissue]
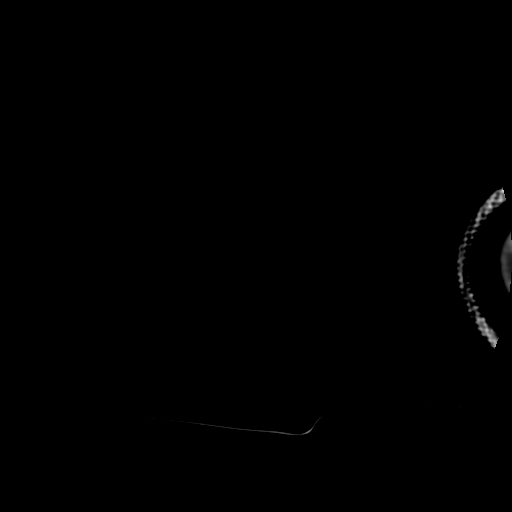
[im 7/82  bone]
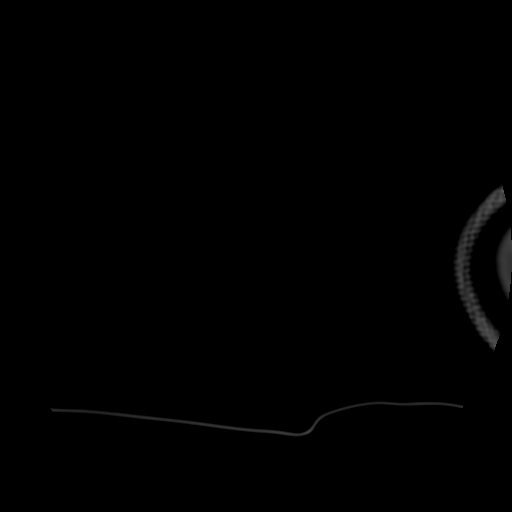
[im 13/82  bone]
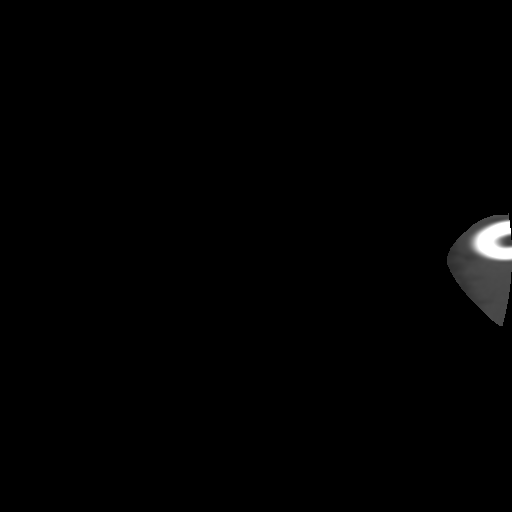
[im 19/82  bone]
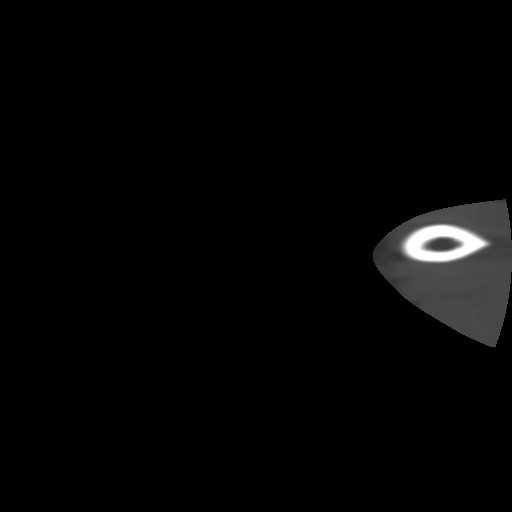
[im 25/82  bone]
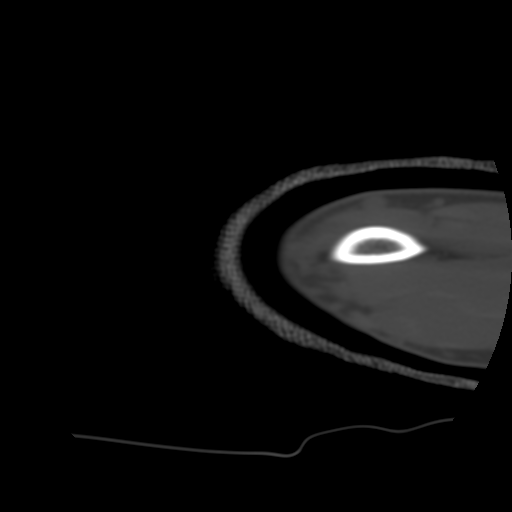
[im 32/82  soft-tissue]
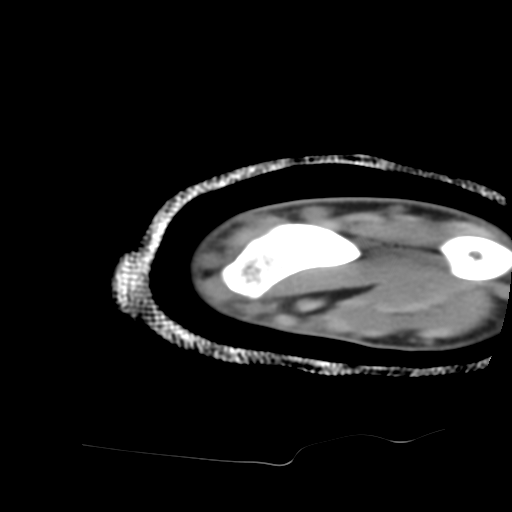
[im 32/82  bone]
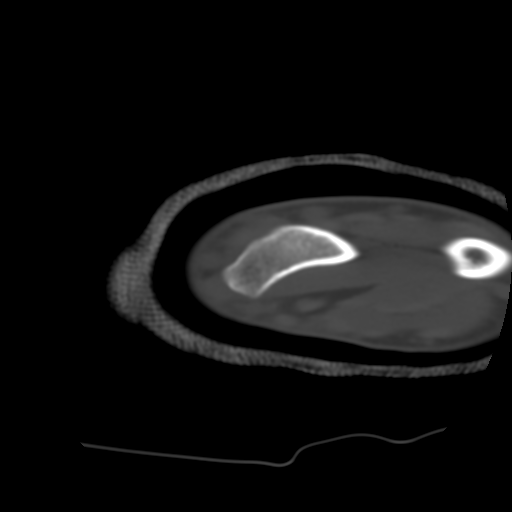
[im 38/82  bone]
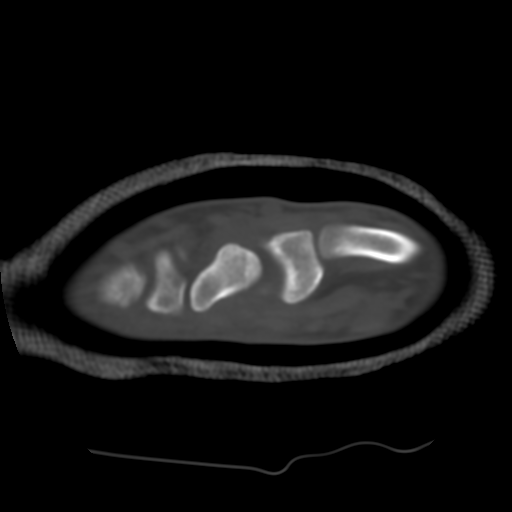
[im 44/82  bone]
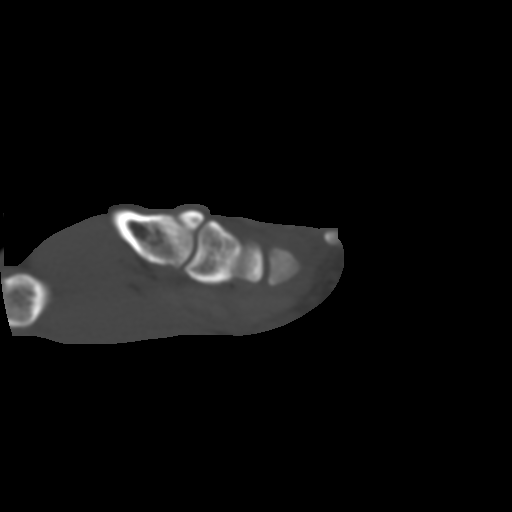
[im 50/82  bone]
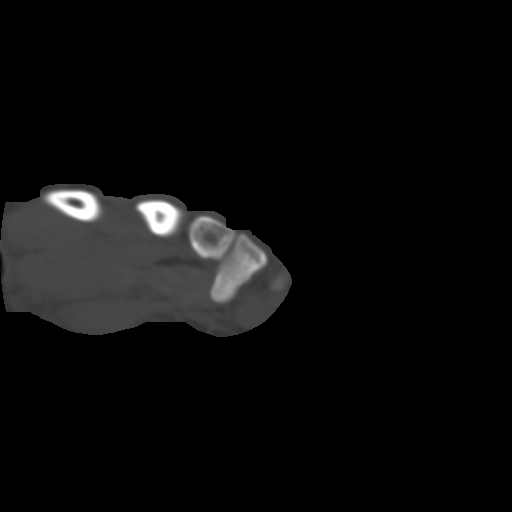
[im 57/82  soft-tissue]
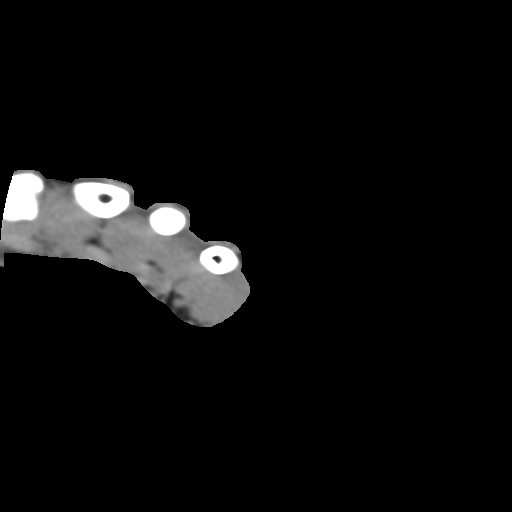
[im 57/82  bone]
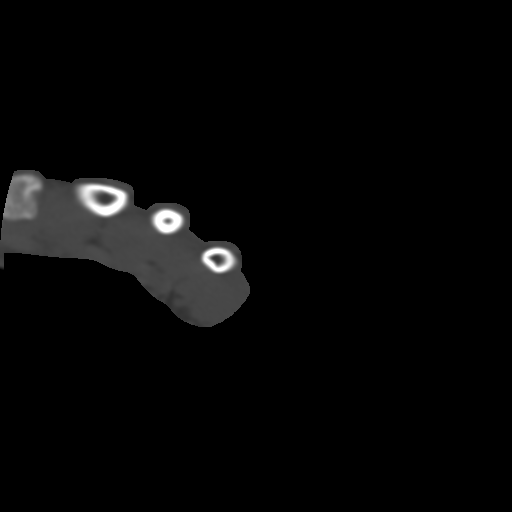
[im 63/82  bone]
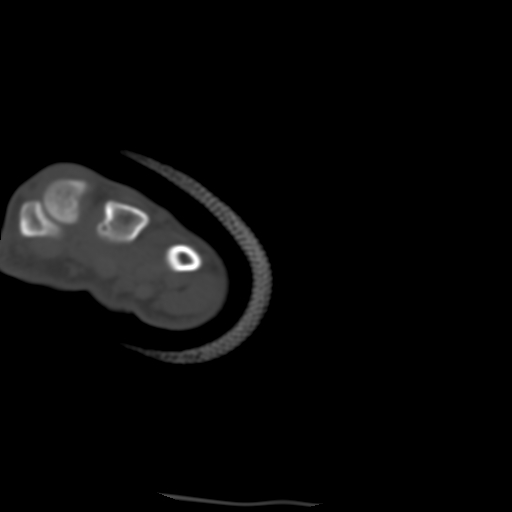
[im 69/82  bone]
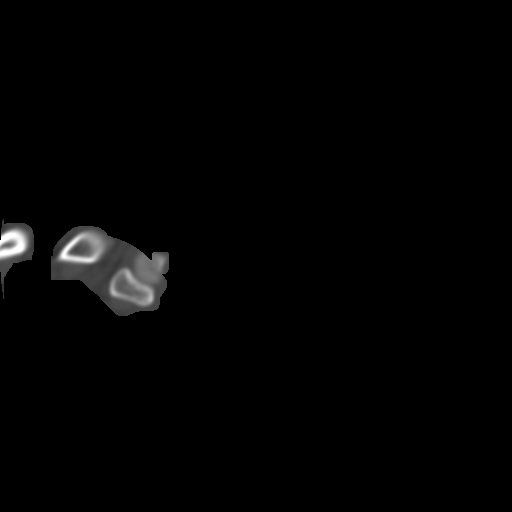
[im 75/82  bone]
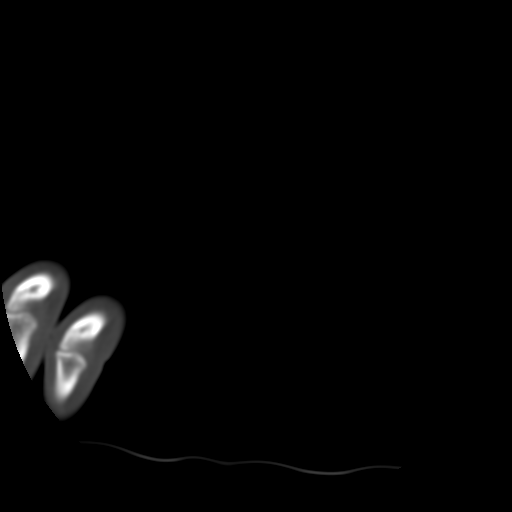

[Series 302: cor st · coronal · 0.29mm/px · 3 of 35 slices shown]
[im 7/35  bone]
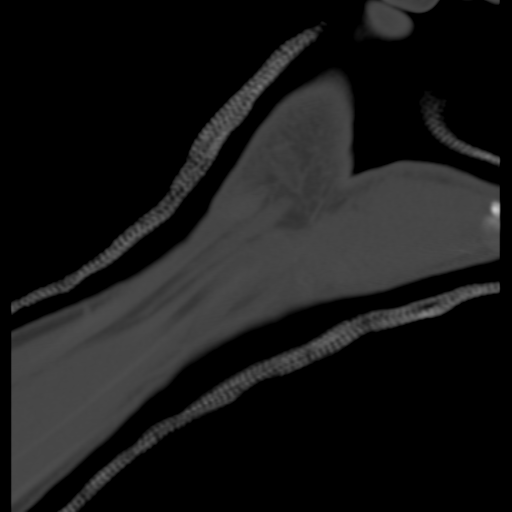
[im 14/35  bone]
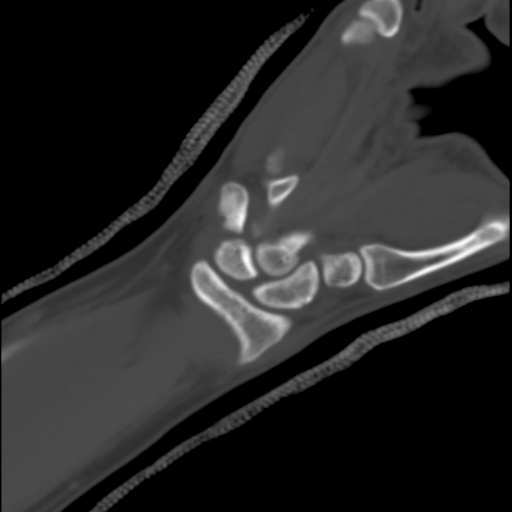
[im 21/35  bone]
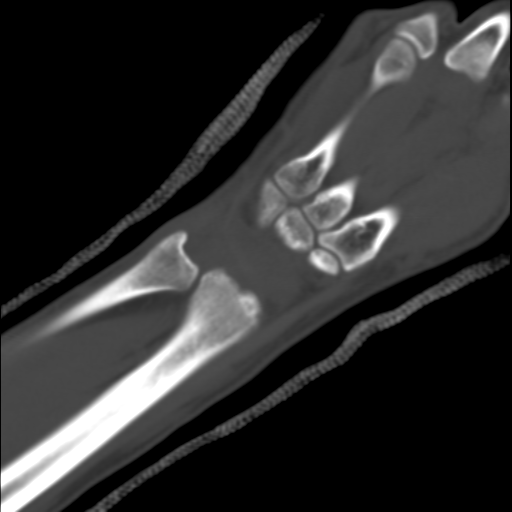

[15 of 20 positions shown; findings below may reference images not displayed]

FINDINGS: The patient is in a cast. A fracture through the proximal waist of
the scaphoid is identified in anatomic position and alignment.
Fracture margins are difficult to visualize consistent with healing.
Minimal sclerosis in the proximal pole can be a normal finding with
healing fracture. No other fracture is seen. Ulnar minus variance is
noted. Soft tissue structures are demonstrates some swelling about
the wrist but are otherwise unremarkable.
IMPRESSION: Healing, nondisplaced fracture through the proximal waist of the
scaphoid. Mild sclerosis in the proximal pole is seen and can be a
normal finding not necessarily indicative of avascular necrosis.

Soft tissue swelling about the wrist.

3-dimensional CT images were rendered by post-processing of the
original CT data at the CT scanner. The 3-dimensional CT images were
interpreted, and findings were reported in the accompanying complete
CT report for this study.

## 2016-12-09 ENCOUNTER — Encounter (HOSPITAL_COMMUNITY): Payer: Self-pay | Admitting: Licensed Clinical Social Worker

## 2016-12-09 ENCOUNTER — Ambulatory Visit (INDEPENDENT_AMBULATORY_CARE_PROVIDER_SITE_OTHER): Payer: 59 | Admitting: Licensed Clinical Social Worker

## 2016-12-09 DIAGNOSIS — F1021 Alcohol dependence, in remission: Secondary | ICD-10-CM

## 2016-12-09 DIAGNOSIS — F1994 Other psychoactive substance use, unspecified with psychoactive substance-induced mood disorder: Secondary | ICD-10-CM
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
## 2016-12-16 ENCOUNTER — Ambulatory Visit (HOSPITAL_COMMUNITY): Payer: 59 | Admitting: Licensed Clinical Social Worker

## 2016-12-23 ENCOUNTER — Ambulatory Visit (INDEPENDENT_AMBULATORY_CARE_PROVIDER_SITE_OTHER): Payer: 59 | Admitting: Licensed Clinical Social Worker

## 2016-12-23 DIAGNOSIS — F1021 Alcohol dependence, in remission: Secondary | ICD-10-CM

## 2016-12-24 ENCOUNTER — Encounter (HOSPITAL_COMMUNITY): Payer: Self-pay | Admitting: Licensed Clinical Social Worker

## 2016-12-24 NOTE — Progress Notes (Signed)
AFTERCARE GROUP  THERAPIST PROGRESS NOTE  Session Time: 5-6:30pm  Participation Level: Active  Behavioral Response: NeatAlertEuthymic  Type of Therapy: Aftercare  Treatment Goals addressed: Coping  Interventions: CBT and Other: Mindfulness based relapse prevention  Summary: Manuel Thomas is a 26 y.o. male who presents with Alcohol Use Disorder in early remission.   Suicidal/Homicidal: NAwithout intent/plan  Therapist Response: Patient continues to report utilization of coping skills for relapse and substance induced mood disorder. He is active and engaged in session. He shares openly with other group members. He continues to report significant thoughts about using marijuana to relax from a stressful day. Counselor helped pt. identify non-drug-related ways of relaxing. Counselor led Mindfulness Based Relapse Prevention curriculum, an EBT for sustaining recovery.  Plan: Return again in 1 week.  Diagnosis: Axis I: Substance Abuse        Margo Common, LPCA 12/24/2016

## 2016-12-26 ENCOUNTER — Other Ambulatory Visit: Payer: Self-pay | Admitting: Internal Medicine

## 2016-12-26 DIAGNOSIS — R5383 Other fatigue: Secondary | ICD-10-CM

## 2016-12-26 DIAGNOSIS — F411 Generalized anxiety disorder: Secondary | ICD-10-CM

## 2016-12-29 ENCOUNTER — Other Ambulatory Visit (HOSPITAL_COMMUNITY): Payer: Self-pay | Admitting: Medical

## 2016-12-30 ENCOUNTER — Ambulatory Visit (HOSPITAL_COMMUNITY): Payer: 59 | Admitting: Licensed Clinical Social Worker

## 2016-12-31 ENCOUNTER — Other Ambulatory Visit: Payer: Self-pay | Admitting: Internal Medicine

## 2016-12-31 DIAGNOSIS — IMO0002 Reserved for concepts with insufficient information to code with codable children: Secondary | ICD-10-CM

## 2016-12-31 MED ORDER — NALTREXONE HCL 50 MG PO TABS: ORAL_TABLET | ORAL | 0 refills | Status: AC

## 2016-12-31 MED ORDER — ACAMPROSATE CALCIUM 333 MG PO TBEC: DELAYED_RELEASE_TABLET | ORAL | 0 refills | Status: AC

## 2016-12-31 MED ORDER — NALTREXONE HCL 50 MG PO TABS: ORAL_TABLET | ORAL | 0 refills | Status: DC

## 2017-01-05 ENCOUNTER — Other Ambulatory Visit (HOSPITAL_COMMUNITY): Payer: Self-pay | Admitting: Medical

## 2017-01-06 ENCOUNTER — Ambulatory Visit (INDEPENDENT_AMBULATORY_CARE_PROVIDER_SITE_OTHER): Payer: 59 | Admitting: Internal Medicine

## 2017-01-06 ENCOUNTER — Ambulatory Visit (INDEPENDENT_AMBULATORY_CARE_PROVIDER_SITE_OTHER): Payer: 59 | Admitting: Licensed Clinical Social Worker

## 2017-01-06 VITALS — BP 114/76 | HR 72 | Temp 97.3°F | Resp 16 | Ht 70.0 in | Wt 156.4 lb

## 2017-01-06 DIAGNOSIS — F1021 Alcohol dependence, in remission: Secondary | ICD-10-CM | POA: Diagnosis not present

## 2017-01-06 DIAGNOSIS — E559 Vitamin D deficiency, unspecified: Secondary | ICD-10-CM

## 2017-01-06 DIAGNOSIS — Z79899 Other long term (current) drug therapy: Secondary | ICD-10-CM

## 2017-01-06 DIAGNOSIS — F1994 Other psychoactive substance use, unspecified with psychoactive substance-induced mood disorder: Secondary | ICD-10-CM | POA: Diagnosis not present

## 2017-01-06 DIAGNOSIS — R0989 Other specified symptoms and signs involving the circulatory and respiratory systems: Secondary | ICD-10-CM

## 2017-01-06 LAB — HEPATIC FUNCTION PANEL
ALK PHOS: 67 U/L (ref 40–115)
ALT: 10 U/L (ref 9–46)
AST: 16 U/L (ref 10–40)
Albumin: 4.3 g/dL (ref 3.6–5.1)
BILIRUBIN DIRECT: 0.1 mg/dL (ref <=0.2)
BILIRUBIN TOTAL: 0.4 mg/dL (ref 0.2–1.2)
Indirect Bilirubin: 0.3 mg/dL (ref 0.2–1.2)
Total Protein: 6.7 g/dL (ref 6.1–8.1)

## 2017-01-06 MED ORDER — VITAMIN D3 125 MCG (5000 UT) PO CAPS: ORAL_CAPSULE | ORAL | Status: AC

## 2017-01-06 NOTE — Progress Notes (Signed)
1/14

## 2017-01-06 NOTE — Progress Notes (Signed)
Patient ID: Manuel Thomas, male   DOB: 1991/08/24, 26 y.o.   MRN: [id number]      This very nice 26 y.o. single WM presents for  follow up with Hy Hyperlipidemia, Pre-Diabetes and Vitamin D Deficiency. Patient has hx/o polysubstance abuse with Alcohol, Opioids and  Marijuana and ibn Nov entered treatment with [facility] and began attending AA meetings, . He was started on Depade and Campraland after comlpeting the out patient program thru [facility], he continues AA and attends counseling with Aftercare now alleging abstinence & sobriety for 6 months. 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     Patient is treated for HTN & BP has been controlled at home. Today's BP: 114/76. Patient has had no complaints of any cardiac type chest pain, palpitations, dyspnea/orthopnea/PND, dizziness, claudication, or dependent edema.     Hyperlipidemia is controlled with diet & meds. Patient denies myalgias or other med SE's. Last Lipids were near goal: Lab Results  Component Value Date   CHOL 180 08/06/2016   HDL 55 08/06/2016   LDLCALC 104 (H) 08/06/2016   TRIG 106 08/06/2016   CHOLHDL 3.3 08/06/2016      Also, the patient has been screened for abnormal glucose/ prediabetes and has had no symptoms of reactive hypoglycemia, diabetic polys, paresthesias or visual blurring.  Last A1c was normal & at goal:  Lab Results  Component Value Date   HGBA1C 5.0 08/06/2016      Further, the patient also has history of Vitamin D Deficiency ("31" in Dec 2017). and supplements vitamin D at 5,000 units/daily    Current Outpatient Prescriptions on File Prior to Visit  Medication Sig  . acamprosate (CAMPRAL) 333 MG  Take 1 tablet 3 x / day with meals  . naltrexone (DEPADE) 50 MG tablet Take 1 tablet every morning  . sertraline (ZOLOFT) 100 MG tablet TAKE 1 TABLET BY MOUTH   DAILY - FOR MOOD   Allergies  Allergen Reactions  . Dilaudid [Hydromorphone Hcl]    PMHx:   Past Medical History:  Diagnosis  Date  . Asthma   . Prediabetes   . Vitamin D deficiency    Immunization History  Administered Date(s) Administered  . Influenza,inj,quad, With Preservative 08/06/2016  . PPD Test 02/19/2014, 04/29/2015  . Td 08/21/2012   Past Surgical History:  Procedure Laterality Date  . ADENOIDECTOMY    . CLOSED REDUCTION FOREARM FRACTURE Left 2005  . INGUINAL HERNIA REPAIR  2003  . TONSILLECTOMY     FHx:    Reviewed / unchanged  SHx:    Reviewed / unchanged  Systems Review:  Constitutional: Denies fever, chills, wt changes, headaches, insomnia, fatigue, night sweats, change in appetite. Eyes: Denies redness, blurred vision, diplopia, discharge, itchy, watery eyes.  ENT: Denies discharge, congestion, post nasal drip, epistaxis, sore throat, earache, hearing loss, dental pain, tinnitus, vertigo, sinus pain, snoring.  CV: Denies chest pain, palpitations, irregular heartbeat, syncope, dyspnea, diaphoresis, orthopnea, PND, claudication or edema. Respiratory: denies cough, dyspnea, DOE, pleurisy, hoarseness, laryngitis, wheezing.  Gastrointestinal: Denies dysphagia, odynophagia, heartburn, reflux, water brash, abdominal pain or cramps, nausea, vomiting, bloating, diarrhea, constipation, hematemesis, melena, hematochezia  or hemorrhoids. Genitourinary: Denies dysuria, frequency, urgency, nocturia, hesitancy, discharge, hematuria or flank pain. Musculoskeletal: Denies arthralgias, myalgias, stiffness, jt. swelling, pain, limping or strain/sprain.  Skin: Denies pruritus, rash, hives, warts, acne, eczema or change in skin lesion(s). Neuro: No weakness, tremor, incoordination, spasms, paresthesia or pain. Psychiatric: Denies confusion, memory loss or sensory loss. Endo:  Denies change in weight, skin or hair change.  Heme/Lymph: No excessive bleeding, bruising or enlarged lymph nodes.  Physical Exam  BP 114/76   Pulse 72   Temp 97.3 F (36.3 C)   Resp 16   Ht  (1.778 m)   Wt 156 lb 6.4 oz  (70.9 kg)   BMI 22.44 kg/m   Appears well nourished, well groomed  and in no distress.  Eyes: PERRLA, EOMs, conjunctiva no swelling or erythema. Sinuses: No frontal/maxillary tenderness ENT/Mouth: EAC's clear, TM's nl w/o erythema, bulging. Nares clear w/o erythema, swelling, exudates. Oropharynx clear without erythema or exudates. Oral hygiene is good. Tongue normal, non obstructing. Hearing intact.  Neck: Supple. Thyroid nl. Car 2+/2+ without bruits, nodes or JVD. Chest: Respirations nl with BS clear & equal w/o rales, rhonchi, wheezing or stridor.  Cor: Heart sounds normal w/ regular rate and rhythm without sig. murmurs, gallops, clicks or rubs. Peripheral pulses normal and equal  without edema.  Abdomen: Soft & bowel sounds normal. Non-tender w/o guarding, rebound, hernias, masses or organomegaly.  Lymphatics: Unremarkable.  Musculoskeletal: Full ROM all peripheral extremities, joint stability, 5/5 strength and normal gait.  Skin: Warm, dry without exposed rashes, lesions or ecchymosis apparent.  Neuro: Cranial nerves intact, reflexes equal bilaterally. Sensory-motor testing grossly intact. Tendon reflexes grossly intact.  Pysch: Alert & oriented x 3.  Insight and judgement nl & appropriate. No ideations.  Assessment and Plan:  1. Labile hypertension, hx  - Continue monitor blood pressure at home.  - Continue DASH diet. Reminder to go to the ER if any CP,  SOB, nausea, dizziness, severe HA, changes vision/speech,  left arm numbness and tingling and jaw pain. - Continue diet/meds, exercise,& lifestyle modifications.   2. Vitamin D deficiency  - VITAMIN D 25 Hydroxy   3. Medication management  - Hepatic function panel - VITAMIN D 25 Hydroxy        Discussed  regular exercise, BP monitoring, weight control to achieve/maintain BMI less than 25 and discussed med and SE's. Recommended labs to assess and monitor clinical status with further disposition pending results of labs.  Over 20 minutes of exam, counseling, chart review was performed. Recommended f/u in 3 months.

## 2017-01-07 ENCOUNTER — Encounter (HOSPITAL_COMMUNITY): Payer: Self-pay | Admitting: Licensed Clinical Social Worker

## 2017-01-07 LAB — VITAMIN D 25 HYDROXY (VIT D DEFICIENCY, FRACTURES): Vit D, 25-Hydroxy: 37 ng/mL (ref 30–100)

## 2017-01-07 NOTE — Progress Notes (Signed)
[facility] AFTERCARE GROUP  THERAPIST PROGRESS NOTE  Session Time: 5-6pm  Participation Level: Active  Behavioral Response: Well GroomedAlertEuthymic  Type of Therapy: Group Therapy  Treatment Goals addressed: Coping  Interventions: CBT and Other: Mindfulness based relapse prevention  Summary: Manuel Thomas is a 26 y.o. male who presents with Alcohol use disorder in early remission.   Suicidal/Homicidal: Nowithout intent/plan  Therapist Response: Patient was engaged in therapy and continued to exhibit positive behaviors and recovery-based coping skills for managing his mild anxiety and stress from work. Pt. stated he was feeling frustrated from his job but used breathing techniques to calm himself and complete the tasks required. Pt. stated he is seeing progress and feeling very positive about his recovery. He denies cravings or thoughts to drink alcohol but continues to endorse thoughts of using marijuana when he feels stressed. Pt. shared about his relationship with his sponsor and his AA attendance.   Plan: Return again in 1 weeks.  Diagnosis: Axis I: Substance Abuse       Margo Common, LPCA 01/07/2017

## 2017-01-13 ENCOUNTER — Ambulatory Visit (INDEPENDENT_AMBULATORY_CARE_PROVIDER_SITE_OTHER): Payer: 59 | Admitting: Licensed Clinical Social Worker

## 2017-01-13 DIAGNOSIS — F1021 Alcohol dependence, in remission: Secondary | ICD-10-CM

## 2017-01-14 ENCOUNTER — Encounter (HOSPITAL_COMMUNITY): Payer: Self-pay | Admitting: Licensed Clinical Social Worker

## 2017-01-14 NOTE — Progress Notes (Signed)
[facility] AFTERCARE GROUP  THERAPIST PROGRESS NOTE  Session Time: 5-6:30pm  Participation Level: Active  Behavioral Response: Well GroomedAlertEuthymic  Type of Therapy: Group Therapy  Treatment Goals addressed: Diagnosis: SUD  Interventions: CBT and Other: Mindfulness based Relapse Prevention  Summary: Manuel Thomas is a 26 y.o. male who presents with Alcohol Use Disorder in sustained remission with a hx of substance induced mood disorder.   Suicidal/Homicidal: Nowithout intent/plan  Therapist Response: Patient was engaged in tx and stated he was feeling positive and satisfied with a "good day off from work". He denies cravings or triggers to use. He identified himself as struggling with ongoing loneliness and difficulty adjusting to "sustained single-ness". Pt. engaged mindfulness exercises and reported on their immediate benefits. he appears more comfortable integrating mindfulness into his daily life and recovery.   Plan: Return again in 1 week.  Diagnosis:   ICD-9-CM ICD-10-CM   1. Alcohol use disorder, severe, in sustained remission, dependence [facility]) 303.93 F10.21        Margo Common, LPCA 01/14/2017

## 2017-01-20 ENCOUNTER — Ambulatory Visit (INDEPENDENT_AMBULATORY_CARE_PROVIDER_SITE_OTHER): Payer: 59 | Admitting: Licensed Clinical Social Worker

## 2017-01-20 DIAGNOSIS — F1021 Alcohol dependence, in remission: Secondary | ICD-10-CM

## 2017-01-22 ENCOUNTER — Encounter (HOSPITAL_COMMUNITY): Payer: Self-pay | Admitting: Licensed Clinical Social Worker

## 2017-01-25 NOTE — Progress Notes (Signed)
[facility] AFTERCARE GROUP  THERAPIST PROGRESS NOTE  Session Time: 5-6:30pm  Participation Level: Active  Behavioral Response: Well GroomedAlertEuthymic  Type of Therapy: Group Therapy  Treatment Goals addressed: Diagnosis: SUD  Interventions: Meditation: Mindfulness Based Relapse Prevention  Summary: Manuel Thomas is a 26 y.o. male who presents with Alcohol Use Disorder in sustained remission. He denies cravings and difficult thoughts to use. He presented as upbeat and engaged in session. He states mindfulness is helping him cope with stressors from his job and life. He reports his medications are stable.    Suicidal/Homicidal: Nowithout intent/plan  Therapist Response: Patient continues to report positive outcomes due to Mindfulness based relapse prevention. 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 Pt states he is benefiting greatly from Aftercare group and wants to continue for the foreseeable future. Counselor used 2 guided meditations, chain analysis for behavior targeting, and person centered empathy and genuine positive regard to sustain changes.   Plan: Return again in 1 week.  Diagnosis:    ICD-9-CM ICD-10-CM   1. Alcohol use disorder, severe, in sustained remission, dependence [facility]) 303.93 F10.21        Margo CommonWesley E Dawid Dupriest, LPCA 01/25/2017

## 2017-01-27 ENCOUNTER — Ambulatory Visit (INDEPENDENT_AMBULATORY_CARE_PROVIDER_SITE_OTHER): Payer: 59 | Admitting: Licensed Clinical Social Worker

## 2017-01-27 DIAGNOSIS — F1021 Alcohol dependence, in remission: Secondary | ICD-10-CM

## 2017-02-02 ENCOUNTER — Encounter (HOSPITAL_COMMUNITY): Payer: Self-pay | Admitting: Licensed Clinical Social Worker

## 2017-02-02 NOTE — Progress Notes (Signed)
[facility]  THERAPIST PROGRESS NOTE  Session Time: 5-6:30pm  Participation Level: Active  Behavioral Response: Well GroomedAlertEuthymic  Type of Therapy: Group Therapy  Treatment Goals addressed: Diagnosis: SUD  Interventions: Meditation: Mindfulness Based Relapse Prevention  Summary: Manuel Thomas is a 26 y.o. male who presents with Alcohol Use Disorder in sustained remission. He denies cravings and difficult thoughts to use. He presented as upbeat and engaged in session. He states mindfulness is helping him cope with stressors from his job and life. He reports his medications are stable.    Suicidal/Homicidal: Nowithout intent/plan  Therapist Response: Patient continues to report positive outcomes due to Mindfulness based relapse prevention. 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 Pt states he is benefiting greatly from Aftercare group and wants to continue for the foreseeable future. Counselor used mindful outdoor walking, and lovingkindness meditation to increase pt ability to regulate negative emotions.  Plan: Return again in 1 week.  Diagnosis:    ICD-9-CM ICD-10-CM   1. Alcohol use disorder, severe, in sustained remission, dependence [facility](HCC) 303.93 F10.21        Manuel Thomas, LPCA 02/02/2017

## 2017-02-03 ENCOUNTER — Ambulatory Visit (INDEPENDENT_AMBULATORY_CARE_PROVIDER_SITE_OTHER): Payer: 59 | Admitting: Licensed Clinical Social Worker

## 2017-02-03 DIAGNOSIS — F1021 Alcohol dependence, in remission: Secondary | ICD-10-CM

## 2017-02-05 ENCOUNTER — Encounter (HOSPITAL_COMMUNITY): Payer: Self-pay | Admitting: Licensed Clinical Social Worker

## 2017-02-05 NOTE — Progress Notes (Signed)
[facility]  THERAPIST PROGRESS NOTE  Session Time: 5-6:30pm  Participation Level: Active  Behavioral Response: NeatAlertEuthymic  Type of Therapy: Group Therapy  Treatment Goals addressed: Coping  Interventions: CBT  Summary: Manuel Thomas is a 26 y.o. male who presents with Alcohol Use Disorder in early remission. He engages group openly and honestly. He continues to present stable, somewhat flat, and upbeat as he has in previous groups.    Suicidal/Homicidal: Nowithout intent/plan  Therapist Response: Pt responding well to group and states he is gaining increased mindfulness and awareness of his triggers. He reports he is practicing more "non-judging" in his everyday life.  Plan: Return again in 1 week.  Diagnosis:    ICD-9-CM ICD-10-CM   1. Alcohol use disorder, severe, in early remission, dependence [facility](HCC) 303.93 F10.21        Manuel Thomas, LPCA 02/05/2017

## 2017-02-10 ENCOUNTER — Ambulatory Visit (HOSPITAL_COMMUNITY): Payer: 59 | Admitting: Licensed Clinical Social Worker

## 2017-02-17 ENCOUNTER — Ambulatory Visit (HOSPITAL_COMMUNITY): Payer: 59 | Admitting: Licensed Clinical Social Worker

## 2017-02-24 ENCOUNTER — Ambulatory Visit (HOSPITAL_COMMUNITY): Payer: 59 | Admitting: Licensed Clinical Social Worker

## 2017-03-03 ENCOUNTER — Ambulatory Visit (HOSPITAL_COMMUNITY): Payer: 59 | Admitting: Licensed Clinical Social Worker

## 2017-03-03 ENCOUNTER — Other Ambulatory Visit (HOSPITAL_COMMUNITY): Payer: Self-pay | Admitting: Medical

## 2017-03-03 MED ORDER — NALTREXONE HCL 50 MG PO TABS: 50.0000 mg | ORAL_TABLET | Freq: Every day | ORAL | 0 refills | Status: DC

## 2017-03-10 ENCOUNTER — Ambulatory Visit (HOSPITAL_COMMUNITY): Payer: 59 | Admitting: Licensed Clinical Social Worker

## 2017-03-16 ENCOUNTER — Other Ambulatory Visit: Payer: Self-pay | Admitting: Internal Medicine

## 2017-03-17 ENCOUNTER — Other Ambulatory Visit: Payer: Self-pay | Admitting: Internal Medicine

## 2017-03-17 ENCOUNTER — Ambulatory Visit (HOSPITAL_COMMUNITY): Payer: 59 | Admitting: Licensed Clinical Social Worker

## 2017-03-17 DIAGNOSIS — R5383 Other fatigue: Secondary | ICD-10-CM

## 2017-03-17 DIAGNOSIS — F411 Generalized anxiety disorder: Secondary | ICD-10-CM

## 2017-03-19 ENCOUNTER — Other Ambulatory Visit: Payer: Self-pay | Admitting: Internal Medicine

## 2017-03-24 ENCOUNTER — Ambulatory Visit (HOSPITAL_COMMUNITY): Payer: 59 | Admitting: Licensed Clinical Social Worker

## 2017-05-03 ENCOUNTER — Ambulatory Visit (INDEPENDENT_AMBULATORY_CARE_PROVIDER_SITE_OTHER): Payer: 59 | Admitting: Internal Medicine

## 2017-05-03 ENCOUNTER — Encounter: Payer: Self-pay | Admitting: Internal Medicine

## 2017-05-03 VITALS — BP 118/76 | HR 72 | Temp 97.5°F | Resp 16 | Ht 70.0 in | Wt 168.0 lb

## 2017-05-03 DIAGNOSIS — R0989 Other specified symptoms and signs involving the circulatory and respiratory systems: Secondary | ICD-10-CM

## 2017-05-03 DIAGNOSIS — F1020 Alcohol dependence, uncomplicated: Secondary | ICD-10-CM

## 2017-05-03 MED ORDER — NALTREXONE HCL 50 MG PO TABS: 50.0000 mg | ORAL_TABLET | Freq: Every morning | ORAL | 3 refills | Status: DC

## 2017-05-03 NOTE — Progress Notes (Signed)
  Subjective:    Patient ID: Manuel Thomas, male    DOB: 10/17/1990, 26 y.o.   MRN: [id number]  HPI  This nice 26 yo singe WM with hx/o poly substance abuse presents alleging 9 month sobriety and is off of his naltrexone for 3-4 days and has maintained last several months on Campral to 1 tablet 2 x / day  - occasionally omitting doses.  He continues on Sertraline and reports mood is stable and has had no temptation to relapse back to drugs or alcohol. He continues with AA. Monitoring BP's off meds & Nl.   Medication Sig  . VITAMIN D 5000 units  Takes 2 capsdaily  . naltrexone 50 MG tablet TAKE 1 TAB EVERY MORNING  . sertraline  100 MG tablet TAKE 1 TAB DAILY FOR MOOD  . acamprosate (CAMPRAL) 333 MG  TAKE 1 TAB 3 TIMES DAY - takes 1-2  X / day    Allergies  Allergen Reactions  . Dilaudid [Hydromorphone Hcl]    Past Medical History:  Diagnosis Date  . Asthma   . Prediabetes   . Vitamin D deficiency    Review of Systems  10 point systems review negative except as above.    Objective:   Physical Exam   BP 118/76   Pulse 72   Temp (!) 97.5 F (36.4 C)   Resp 16   Ht 5\' 10"  (1.778 m)   Wt 168 lb (76.2 kg)   BMI 24.11 kg/m   HEENT - WNL. Neck - supple.  Chest - Clear equal BS. Cor - Nl HS. RRR w/o sig MGR. PP 1(+). No edema. MS- FROM w/o deformities.  Gait Nl. Neuro -  Nl w/o focal abnormalities.    Assessment & Plan:   1. Labile hypertension  - in observation mode   - discussed taper to D/CD Campral and written Rx to continue Naltrexone 50 mg # 30  X 3 rf  - ROV - 3 mo or prn.

## 2017-05-31 ENCOUNTER — Encounter: Payer: Self-pay | Admitting: Internal Medicine

## 2017-06-05 ENCOUNTER — Other Ambulatory Visit: Payer: Self-pay | Admitting: Internal Medicine

## 2017-06-05 DIAGNOSIS — R5383 Other fatigue: Secondary | ICD-10-CM

## 2017-06-05 DIAGNOSIS — F411 Generalized anxiety disorder: Secondary | ICD-10-CM

## 2017-07-23 ENCOUNTER — Encounter: Payer: Self-pay | Admitting: Internal Medicine

## 2017-09-07 ENCOUNTER — Ambulatory Visit: Payer: Self-pay | Admitting: Internal Medicine

## 2017-09-07 NOTE — Progress Notes (Addendum)
NO SHOW

## 2017-10-12 ENCOUNTER — Other Ambulatory Visit: Payer: Self-pay | Admitting: Internal Medicine

## 2017-10-12 DIAGNOSIS — F1020 Alcohol dependence, uncomplicated: Secondary | ICD-10-CM

## 2017-10-17 NOTE — Progress Notes (Signed)
[facility]   Manuel Thomas, [name]     Dyanne Carrel. Steffanie Dunn, P.A.-C Judd Gaudier, DNP [facility]                [address]                [place], [place]. [address] Telephone [phone] Telefax [phone] Annual  Screening/Preventative Visit  & Comprehensive Evaluation & Examination     This very nice 27 y.o. single WM  presents for a Screening/Preventative Visit & comprehensive evaluation and management of multiple medical co-morbidities.  Patient has been followed expectantly for elevated BP, lipids, glucose and Vitamin D Deficiency. Patient has prior hx/o polysubstance abuse with alcohol & opioids and had gone thru rehab and is still attending AA 3 x/week  and claims sobriety x 15 months. Currently he's on step #7       Patient has hx/o mildly elevated BP's in the past & is followed expectantly. Patient's BP has been controlled at home.  Today's BP is at goal - 112/82. Patient denies any cardiac symptoms as chest pain, palpitations, shortness of breath, dizziness or ankle swelling.     Patient's hyperlipidemia is near controlled with diet. Last lipids were near goal: Lab Results  Component Value Date   CHOL 180 08/06/2016   HDL 55 08/06/2016   LDLCALC 104 (H) 08/06/2016   TRIG 106 08/06/2016   CHOLHDL 3.3 08/06/2016      Patient has prediabetes (A1c 5.7%/2013) and patient denies reactive hypoglycemic symptoms, visual blurring, diabetic polys or paresthesias. Last A1c was Normal & at goal:  Lab Results  Component Value Date   HGBA1C 5.0 08/06/2016       Finally, patient has history of Vitamin D Deficiency ('23"/2012) and last vitamin D was still low: Lab Results  Component Value Date   VD25OH 37 01/06/2017   Current Outpatient Medications on File Prior to Visit  Medication Sig  . VITAMIN D 5000 units Takes 2 capsule daily  . naltrexone (DEPADE) 50 MG TAKE ONE TAB  DAILY   . sertraline  100 MG TAKE 1  TAB  DAILY FOR MOOD   Allergies  Allergen Reactions  . Dilaudid [Hydromorphone Hcl]    Past Medical History:  Diagnosis Date  . Asthma   . Prediabetes   . Vitamin D deficiency    Health Maintenance  Topic Date Due  . INFLUENZA VACCINE  04/21/2017  . TETANUS/TDAP  08/21/2022  . HIV Screening  Completed   Immunization History  Administered Date(s) Administered  . Influenza,inj,quad, With Preservative 08/06/2016  . PPD Test 02/19/2014, 04/29/2015, 10/18/2017  . Td 08/21/2012   Past Surgical History:  Procedure Laterality Date  . ADENOIDECTOMY    . CLOSED REDUCTION FOREARM FRACTURE Left 2005  . INGUINAL HERNIA REPAIR  2003  . TONSILLECTOMY     Family History  Problem Relation Age of Onset  . Heart disease Mother   . Heart attack Other   . Arthritis Other   . Hypertension Other   . Diabetes Other   . Stroke Other   . Alcohol abuse Paternal Grandfather    Social History   Socioeconomic History  . Marital status: Single  Occupational History  . Not on file  Tobacco Use  . Smoking status: Former Smoker    Types: Cigarettes    Last attempt to quit: 07/26/2016    Years since quitting: 1.2  . Smokeless tobacco: Never Used  Substance and  Sexual Activity  . Alcohol use: No, 40mo sober  . Drug use: No  . Sexual activity: Not Currently    ROS Constitutional: Denies fever, chills, weight loss/gain, headaches, insomnia,  night sweats or change in appetite. Does c/o fatigue. Eyes: Denies redness, blurred vision, diplopia, discharge, itchy or watery eyes.  ENT: Denies discharge, congestion, post nasal drip, epistaxis, sore throat, earache, hearing loss, dental pain, Tinnitus, Vertigo, Sinus pain or snoring.  Cardio: Denies chest pain, palpitations, irregular heartbeat, syncope, dyspnea, diaphoresis, orthopnea, PND, claudication or edema Respiratory: denies cough, dyspnea, DOE, pleurisy, hoarseness, laryngitis or wheezing.  Gastrointestinal: Denies dysphagia, heartburn,  reflux, water brash, pain, cramps, nausea, vomiting, bloating, diarrhea, constipation, hematemesis, melena, hematochezia, jaundice or hemorrhoids Genitourinary: Denies dysuria, frequency, urgency, nocturia, hesitancy, discharge, hematuria or flank pain Musculoskeletal: Denies arthralgia, myalgia, stiffness, Jt. Swelling, pain, limp or strain/sprain. Denies Falls. Skin: Denies puritis, rash, hives, warts, acne, eczema or change in skin lesion Neuro: No weakness, tremor, incoordination, spasms, paresthesia or pain Psychiatric: Denies confusion, memory loss or sensory loss. Denies Depression. Endocrine: Denies change in weight, skin, hair change, nocturia, and paresthesia, diabetic polys, visual blurring or hyper / hypo glycemic episodes.  Heme/Lymph: No excessive bleeding, bruising or enlarged lymph nodes.  Physical Exam  BP 112/82   Pulse 60   Temp (!) 97.3 F (36.3 C)   Resp 16   Ht 5' 10.5" (1.791 m)   Wt 167 lb 9.6 oz (76 kg)   BMI 23.71 kg/m   General Appearance: Well nourished and well groomed and in no apparent distress.  Eyes: PERRLA, EOMs, conjunctiva no swelling or erythema, normal fundi and vessels. Sinuses: No frontal/maxillary tenderness ENT/Mouth: EACs patent / TMs  nl. Nares clear without erythema, swelling, mucoid exudates. Oral hygiene is good. No erythema, swelling, or exudate. Tongue normal, non-obstructing. Tonsils not swollen or erythematous. Hearing normal.  Neck: Supple, thyroid normal. No bruits, nodes or JVD. Respiratory: Respiratory effort normal.  BS equal and clear bilateral without rales, rhonci, wheezing or stridor. Cardio: Heart sounds are normal with regular rate and rhythm and no murmurs, rubs or gallops. Peripheral pulses are normal and equal bilaterally without edema. No aortic or femoral bruits. Chest: symmetric with normal excursions and percussion.  Abdomen: Soft, with Nl bowel sounds. Nontender, no guarding, rebound, hernias, masses, or organomegaly.   Lymphatics: Non tender without lymphadenopathy.  Genitourinary: No hernias.Testes nl. DRE - prostate nl for age - smooth & firm w/o nodules. Musculoskeletal: Full ROM all peripheral extremities, joint stability, 5/5 strength, and normal gait. Skin: Warm and dry without rashes, lesions, cyanosis, clubbing or  ecchymosis.  Neuro: Cranial nerves intact, reflexes equal bilaterally. Normal muscle tone, no cerebellar symptoms. Sensation intact.  Pysch: Alert and oriented X 3 with normal affect, insight and judgment appropriate.   Assessment and Plan  1. Annual Preventative/Screening Exam   2. Labile hypertension  - Urinalysis, Routine w reflex microscopic - Microalbumin / creatinine urine ratio - CBC with Differential/Platelet - BASIC METABOLIC PANEL WITH GFR - Magnesium - TSH  3. Hyperlipidemia, mixed  - Hepatic function panel - Lipid panel - TSH  4. Prediabetes  - Hemoglobin A1c - Insulin, random  5. Vitamin D deficiency  - VITAMIN D 25 Hydroxy   6. History of alcoholism (HCC)   7. Screening for colorectal cancer  - POC Hemoccult Bld/Stl (3-Cd Home Screen); Future - Iron,Total/Total Iron Binding Cap - Vitamin B12 - Testosterone - CBC with Differential/Platelet - TSH  8. Fatigue, unspecified type  - Urinalysis, Routine  w reflex microscopic - Microalbumin / creatinine urine ratio - CBC with Differential/Platelet - BASIC METABOLIC PANEL WITH GFR - Hepatic function panel - Magnesium - Lipid panel - TSH - Hemoglobin A1c - Insulin, random - VITAMIN D 25 Hydroxy  9. Screening examination for pulmonary tuberculosis  - PPD      Patient was counseled in prudent diet, weight control to achieve/maintain BMI less than 25, BP monitoring, regular exercise and medications as discussed.  Discussed med effects and SE's. Routine screening labs and tests as requested with regular follow-up as recommended. Over 40 minutes of exam, counseling, chart review and high complex  critical decision making was performed

## 2017-10-17 NOTE — Patient Instructions (Signed)

## 2017-10-18 ENCOUNTER — Encounter: Payer: Self-pay | Admitting: Internal Medicine

## 2017-10-18 ENCOUNTER — Ambulatory Visit: Payer: No Typology Code available for payment source | Admitting: Internal Medicine

## 2017-10-18 VITALS — BP 112/82 | HR 60 | Temp 97.3°F | Resp 16 | Ht 70.5 in | Wt 167.6 lb

## 2017-10-18 DIAGNOSIS — F1021 Alcohol dependence, in remission: Secondary | ICD-10-CM

## 2017-10-18 DIAGNOSIS — R0989 Other specified symptoms and signs involving the circulatory and respiratory systems: Secondary | ICD-10-CM

## 2017-10-18 DIAGNOSIS — E559 Vitamin D deficiency, unspecified: Secondary | ICD-10-CM

## 2017-10-18 DIAGNOSIS — Z0000 Encounter for general adult medical examination without abnormal findings: Secondary | ICD-10-CM | POA: Diagnosis not present

## 2017-10-18 DIAGNOSIS — Z111 Encounter for screening for respiratory tuberculosis: Secondary | ICD-10-CM

## 2017-10-18 DIAGNOSIS — Z1211 Encounter for screening for malignant neoplasm of colon: Secondary | ICD-10-CM

## 2017-10-18 DIAGNOSIS — Z0001 Encounter for general adult medical examination with abnormal findings: Secondary | ICD-10-CM

## 2017-10-18 DIAGNOSIS — E782 Mixed hyperlipidemia: Secondary | ICD-10-CM

## 2017-10-18 DIAGNOSIS — R5383 Other fatigue: Secondary | ICD-10-CM

## 2017-10-18 DIAGNOSIS — Z1212 Encounter for screening for malignant neoplasm of rectum: Secondary | ICD-10-CM

## 2017-10-18 DIAGNOSIS — R7303 Prediabetes: Secondary | ICD-10-CM

## 2017-10-19 LAB — LIPID PANEL
Cholesterol: 189 mg/dL (ref <200)
HDL: 40 mg/dL — AB (ref >40)
LDL Cholesterol (Calc): 127 mg/dL (calc) — ABNORMAL HIGH
NON-HDL CHOLESTEROL (CALC): 149 mg/dL — AB (ref <130)
Total CHOL/HDL Ratio: 4.7 (calc) (ref <5.0)
Triglycerides: 114 mg/dL (ref <150)

## 2017-10-19 LAB — CBC WITH DIFFERENTIAL/PLATELET
BASOS ABS: 58 {cells}/uL (ref 0–200)
Basophils Relative: 0.8 %
EOS ABS: 270 {cells}/uL (ref 15–500)
Eosinophils Relative: 3.7 %
HCT: 43.7 % (ref 38.5–50.0)
Hemoglobin: 15.1 g/dL (ref 13.2–17.1)
Lymphs Abs: 2409 cells/uL (ref 850–3900)
MCH: 29.4 pg (ref 27.0–33.0)
MCHC: 34.6 g/dL (ref 32.0–36.0)
MCV: 85 fL (ref 80.0–100.0)
MONOS PCT: 7 %
MPV: 11.7 fL (ref 7.5–12.5)
NEUTROS PCT: 55.5 %
Neutro Abs: 4052 cells/uL (ref 1500–7800)
PLATELETS: 270 10*3/uL (ref 140–400)
RBC: 5.14 10*6/uL (ref 4.20–5.80)
RDW: 12.6 % (ref 11.0–15.0)
TOTAL LYMPHOCYTE: 33 %
WBC: 7.3 10*3/uL (ref 3.8–10.8)
WBCMIX: 511 {cells}/uL (ref 200–950)

## 2017-10-19 LAB — BASIC METABOLIC PANEL WITH GFR
BUN: 9 mg/dL (ref 7–25)
CO2: 27 mmol/L (ref 20–32)
CREATININE: 0.94 mg/dL (ref 0.60–1.35)
Calcium: 9.2 mg/dL (ref 8.6–10.3)
Chloride: 105 mmol/L (ref 98–110)
GFR, EST AFRICAN AMERICAN: 129 mL/min/{1.73_m2} (ref >=60)
GFR, EST NON AFRICAN AMERICAN: 111 mL/min/{1.73_m2} (ref >=60)
Glucose, Bld: 95 mg/dL (ref 65–99)
Potassium: 4.2 mmol/L (ref 3.5–5.3)
SODIUM: 138 mmol/L (ref 135–146)

## 2017-10-19 LAB — VITAMIN D 25 HYDROXY (VIT D DEFICIENCY, FRACTURES): Vit D, 25-Hydroxy: 76 ng/mL (ref 30–100)

## 2017-10-19 LAB — URINALYSIS, ROUTINE W REFLEX MICROSCOPIC
BILIRUBIN URINE: NEGATIVE
GLUCOSE, UA: NEGATIVE
Hgb urine dipstick: NEGATIVE
Ketones, ur: NEGATIVE
LEUKOCYTES UA: NEGATIVE
Nitrite: NEGATIVE
Protein, ur: NEGATIVE
Specific Gravity, Urine: 1.025 (ref 1.001–1.03)

## 2017-10-19 LAB — INSULIN, RANDOM: Insulin: 7.3 u[IU]/mL (ref 2.0–19.6)

## 2017-10-19 LAB — HEPATIC FUNCTION PANEL
AG RATIO: 1.8 (calc) (ref 1.0–2.5)
ALKALINE PHOSPHATASE (APISO): 66 U/L (ref 40–115)
ALT: 10 U/L (ref 9–46)
AST: 12 U/L (ref 10–40)
Albumin: 4.4 g/dL (ref 3.6–5.1)
BILIRUBIN DIRECT: 0.1 mg/dL (ref 0.0–0.2)
BILIRUBIN INDIRECT: 0.3 mg/dL (ref 0.2–1.2)
Globulin: 2.5 g/dL (calc) (ref 1.9–3.7)
Total Bilirubin: 0.4 mg/dL (ref 0.2–1.2)
Total Protein: 6.9 g/dL (ref 6.1–8.1)

## 2017-10-19 LAB — TSH: TSH: 1.19 m[IU]/L (ref 0.40–4.50)

## 2017-10-19 LAB — MAGNESIUM: Magnesium: 2 mg/dL (ref 1.5–2.5)

## 2017-10-19 LAB — VITAMIN B12: VITAMIN B 12: 649 pg/mL (ref 200–1100)

## 2017-10-19 LAB — IRON, TOTAL/TOTAL IRON BINDING CAP
%SAT: 25 % (ref 15–60)
IRON: 80 ug/dL (ref 50–195)
TIBC: 321 mcg/dL (calc) (ref 250–425)

## 2017-10-19 LAB — HEMOGLOBIN A1C
EAG (MMOL/L): 6.2 (calc)
Hgb A1c MFr Bld: 5.5 % of total Hgb (ref <5.7)
Mean Plasma Glucose: 111 (calc)

## 2017-10-19 LAB — MICROALBUMIN / CREATININE URINE RATIO
Creatinine, Urine: 271 mg/dL (ref 20–320)
MICROALB/CREAT RATIO: 2 ug/mg{creat} (ref <30)
Microalb, Ur: 0.5 mg/dL

## 2017-10-19 LAB — TESTOSTERONE: Testosterone: 415 ng/dL (ref 250–827)

## 2017-11-11 ENCOUNTER — Other Ambulatory Visit: Payer: Self-pay | Admitting: Internal Medicine

## 2017-11-11 DIAGNOSIS — R5383 Other fatigue: Secondary | ICD-10-CM

## 2017-11-11 DIAGNOSIS — F411 Generalized anxiety disorder: Secondary | ICD-10-CM

## 2017-11-22 ENCOUNTER — Other Ambulatory Visit: Payer: Self-pay | Admitting: Internal Medicine

## 2017-11-22 DIAGNOSIS — F1020 Alcohol dependence, uncomplicated: Secondary | ICD-10-CM

## 2017-11-23 ENCOUNTER — Other Ambulatory Visit: Payer: Self-pay | Admitting: Internal Medicine

## 2017-11-23 DIAGNOSIS — F1020 Alcohol dependence, uncomplicated: Secondary | ICD-10-CM

## 2017-12-21 ENCOUNTER — Other Ambulatory Visit: Payer: Self-pay | Admitting: Physician Assistant

## 2017-12-21 DIAGNOSIS — F1020 Alcohol dependence, uncomplicated: Secondary | ICD-10-CM

## 2018-01-27 ENCOUNTER — Encounter: Payer: Self-pay | Admitting: Adult Health

## 2018-01-27 NOTE — Progress Notes (Signed)
FOLLOW UP  Assessment and Plan:   Labile hypertension At goal  Monitor blood pressure at home; patient to call if consistently greater than 130/80 Continue DASH diet.   Reminder to go to the ER if any CP, SOB, nausea, dizziness, severe HA, changes vision/speech, left arm numbness and tingling and jaw pain.  Cholesterol Currently borderline above goal Continue low cholesterol diet and exercise.  Defer checking lipid panel to 6 month follow up  BMI 24 Continue to recommend diet heavy in fruits and veggies and low in animal meats, cheeses, and dairy products, appropriate calorie intake Discuss exercise recommendations routinely Continue to monitor weight at each visit  Vitamin D Def At goal at last visit; continue supplementation to maintain goal of 70-100 Defer Vit D level  Depression/anxiety Moods stable; continue current medications  Lifestyle discussed: diet/exerise, sleep hygiene, stress management, hydration  Substance use disorder He reports continued success with sobriety; continue with AA and naltrexone  Continue diet and meds as discussed. Further disposition pending results of labs. Discussed med's effects and SE's.   Over 30 minutes of exam, counseling, chart review, and critical decision making was performed.   Future Appointments  Date Time Provider Department Center  05/09/2018  9:30 AM Lucky Cowboy, MD [facility] None  11/02/2018  9:00 AM Lucky Cowboy, MD [facility] None    ----------------------------------------------------------------------------------------------------------------------  HPI 27 y.o. very pleasant Caucasian male  presents for 3 month follow up on BP, cholesterol, depression/anxiety, substance use disorder and vitamin D deficiency. Patient has prior hx of polysubstance abuse with alcohol & opioids, went through rehab and is still attending AA 3 x/week  and claims sobriety x 18 months. Currently he's on step #10/11.  BMI is Body mass  index is 24.19 kg/m., he has been working on diet and exercise - is trying to exercise more, riding bike, pushups, etc. He has been doing intermittent fasting and skpping breakfast.  Wt Readings from Last 3 Encounters:  01/31/18 171 lb (77.6 kg)  10/18/17 167 lb 9.6 oz (76 kg)  05/03/17 168 lb (76.2 kg)   Today their BP is BP: 108/76  He does workout. He denies chest pain, shortness of breath, dizziness.   He is not on cholesterol medication. His cholesterol is not at goal. The cholesterol last visit was:   Lab Results  Component Value Date   CHOL 189 10/18/2017   HDL 40 (L) 10/18/2017   LDLCALC 127 (H) 10/18/2017   TRIG 114 10/18/2017   CHOLHDL 4.7 10/18/2017   Patient is on Vitamin D supplement.   Lab Results  Component Value Date   VD25OH 76 10/18/2017       Current Medications:  Current Outpatient Medications on File Prior to Visit  Medication Sig  . Cholecalciferol (VITAMIN D3) 5000 units CAPS Takes 1 capsule daily  . naltrexone (DEPADE) 50 MG tablet TAKE 1 TABLET BY MOUTH ONCE DAILY  . sertraline (ZOLOFT) 100 MG tablet TAKE 1 TABLET BY MOUTH   DAILY FOR MOOD   No current facility-administered medications on file prior to visit.      Allergies:  Allergies  Allergen Reactions  . Dilaudid [Hydromorphone Hcl]      Medical History:  Past Medical History:  Diagnosis Date  . Acute alcoholic hepatitis 07/29/2016  . Asthma   . Prediabetes   . Vitamin D deficiency    Family history- Reviewed and unchanged Social history- Reviewed and unchanged   Review of Systems:  Review of Systems  Constitutional: Negative for malaise/fatigue and  weight loss.  HENT: Negative for hearing loss and tinnitus.   Eyes: Negative for blurred vision and double vision.  Respiratory: Negative for cough, shortness of breath and wheezing.   Cardiovascular: Negative for chest pain, palpitations, orthopnea, claudication and leg swelling.  Gastrointestinal: Negative for abdominal pain,  blood in stool, constipation, diarrhea, heartburn, melena, nausea and vomiting.  Genitourinary: Negative.   Musculoskeletal: Negative for joint pain and myalgias.  Skin: Negative for rash.  Neurological: Negative for dizziness, tingling, sensory change, weakness and headaches.  Endo/Heme/Allergies: Negative for polydipsia.  Psychiatric/Behavioral: Negative.   All other systems reviewed and are negative.     Physical Exam: BP 108/76   Pulse 75   Temp (!) 97.3 F (36.3 C)   Ht 5' 10.5" (1.791 m)   Wt 171 lb (77.6 kg)   SpO2 98%   BMI 24.19 kg/m  Wt Readings from Last 3 Encounters:  01/31/18 171 lb (77.6 kg)  10/18/17 167 lb 9.6 oz (76 kg)  05/03/17 168 lb (76.2 kg)   General Appearance: Well nourished, in no apparent distress. Eyes: PERRLA, EOMs, conjunctiva no swelling or erythema Sinuses: No Frontal/maxillary tenderness ENT/Mouth: Ext aud canals clear, TMs without erythema, bulging. No erythema, swelling, or exudate on post pharynx.  Tonsils not swollen or erythematous. Hearing normal.  Neck: Supple, thyroid normal.  Respiratory: Respiratory effort normal, BS equal bilaterally without rales, rhonchi, wheezing or stridor.  Cardio: RRR with no MRGs. Brisk peripheral pulses without edema.  Abdomen: Soft, + BS.  Non tender, no guarding, rebound, hernias, masses. Lymphatics: Non tender without lymphadenopathy.  Musculoskeletal: Full ROM, 5/5 strength, Normal gait Skin: Warm, dry without rashes, lesions, ecchymosis.  Neuro: Cranial nerves intact. No cerebellar symptoms.  Psych: Awake and oriented X 3, normal affect, Insight and Judgment appropriate.    Dan Maker, NP 9:38 AM [facility]

## 2018-01-31 ENCOUNTER — Ambulatory Visit: Payer: No Typology Code available for payment source | Admitting: Adult Health

## 2018-01-31 ENCOUNTER — Encounter: Payer: Self-pay | Admitting: Adult Health

## 2018-01-31 VITALS — BP 108/76 | HR 75 | Temp 97.3°F | Ht 70.5 in | Wt 171.0 lb

## 2018-01-31 DIAGNOSIS — Z79899 Other long term (current) drug therapy: Secondary | ICD-10-CM | POA: Diagnosis not present

## 2018-01-31 DIAGNOSIS — R0989 Other specified symptoms and signs involving the circulatory and respiratory systems: Secondary | ICD-10-CM

## 2018-01-31 DIAGNOSIS — Z6824 Body mass index (BMI) 24.0-24.9, adult: Secondary | ICD-10-CM | POA: Diagnosis not present

## 2018-01-31 DIAGNOSIS — F1994 Other psychoactive substance use, unspecified with psychoactive substance-induced mood disorder: Secondary | ICD-10-CM | POA: Diagnosis not present

## 2018-01-31 DIAGNOSIS — E559 Vitamin D deficiency, unspecified: Secondary | ICD-10-CM | POA: Diagnosis not present

## 2018-01-31 DIAGNOSIS — E782 Mixed hyperlipidemia: Secondary | ICD-10-CM

## 2018-01-31 LAB — COMPLETE METABOLIC PANEL WITH GFR
AG RATIO: 2 (calc) (ref 1.0–2.5)
ALT: 9 U/L (ref 9–46)
AST: 11 U/L (ref 10–40)
Albumin: 4.5 g/dL (ref 3.6–5.1)
Alkaline phosphatase (APISO): 73 U/L (ref 40–115)
BUN: 9 mg/dL (ref 7–25)
CALCIUM: 9.4 mg/dL (ref 8.6–10.3)
CO2: 25 mmol/L (ref 20–32)
CREATININE: 1.03 mg/dL (ref 0.60–1.35)
Chloride: 102 mmol/L (ref 98–110)
GFR, EST NON AFRICAN AMERICAN: 100 mL/min/{1.73_m2} (ref >=60)
GFR, Est African American: 116 mL/min/{1.73_m2} (ref >=60)
GLUCOSE: 99 mg/dL (ref 65–99)
Globulin: 2.2 g/dL (calc) (ref 1.9–3.7)
Potassium: 3.9 mmol/L (ref 3.5–5.3)
Sodium: 136 mmol/L (ref 135–146)
TOTAL PROTEIN: 6.7 g/dL (ref 6.1–8.1)
Total Bilirubin: 0.5 mg/dL (ref 0.2–1.2)

## 2018-01-31 LAB — LIPID PANEL
CHOL/HDL RATIO: 5.9 (calc) — AB (ref <5.0)
Cholesterol: 217 mg/dL — ABNORMAL HIGH (ref <200)
HDL: 37 mg/dL — AB (ref >40)
LDL CHOLESTEROL (CALC): 141 mg/dL — AB
NON-HDL CHOLESTEROL (CALC): 180 mg/dL — AB (ref <130)
TRIGLYCERIDES: 255 mg/dL — AB (ref <150)

## 2018-01-31 LAB — CBC WITH DIFFERENTIAL/PLATELET
BASOS PCT: 0.8 %
Basophils Absolute: 66 cells/uL (ref 0–200)
EOS PCT: 3.1 %
Eosinophils Absolute: 257 cells/uL (ref 15–500)
HCT: 44.9 % (ref 38.5–50.0)
Hemoglobin: 15.3 g/dL (ref 13.2–17.1)
Lymphs Abs: 2548 cells/uL (ref 850–3900)
MCH: 28.8 pg (ref 27.0–33.0)
MCHC: 34.1 g/dL (ref 32.0–36.0)
MCV: 84.6 fL (ref 80.0–100.0)
MPV: 11.8 fL (ref 7.5–12.5)
Monocytes Relative: 7.2 %
Neutro Abs: 4831 cells/uL (ref 1500–7800)
Neutrophils Relative %: 58.2 %
PLATELETS: 291 10*3/uL (ref 140–400)
RBC: 5.31 10*6/uL (ref 4.20–5.80)
RDW: 12.6 % (ref 11.0–15.0)
Total Lymphocyte: 30.7 %
WBC: 8.3 10*3/uL (ref 3.8–10.8)
WBCMIX: 598 {cells}/uL (ref 200–950)

## 2018-01-31 NOTE — Patient Instructions (Signed)
Preventing High Cholesterol Cholesterol is a waxy, fat-like substance that your body needs in small amounts. Your liver makes all the cholesterol that your body needs. Having high cholesterol (hypercholesterolemia) increases your risk for heart disease and stroke. Extra (excess) cholesterol comes from the food you eat, such as animal-based fat (saturated fat) from meat and some dairy products. High cholesterol can often be prevented with diet and lifestyle changes. If you already have high cholesterol, you can control it with diet and lifestyle changes, as well as medicine. What nutrition changes can be made?  Eat less saturated fat. Foods that contain saturated fat include red meat and some dairy products.  Avoid processed meats, like bacon and lunch meats.  Avoid trans fats, which are found in margarine and some baked goods.  Avoid foods and beverages that have added sugars.  Eat more fruits, vegetables, and whole grains.  Choose healthy sources of protein, such as fish, poultry, and nuts.  Choose healthy sources of fat, such as: ? Nuts. ? Vegetable oils, especially olive oil. ? Fish that have healthy fats (omega-3 fatty acids), such as mackerel or salmon. What lifestyle changes can be made?  Lose weight if you are overweight. Losing 5-10 lb (2.3-4.5 kg) can help prevent or control high cholesterol and reduce your risk for diabetes and high blood pressure. Ask your health care provider to help you with a diet and exercise plan to safely lose weight.  Get enough exercise. Do at least 150 minutes of moderate-intensity exercise each week. ? You could do this in short exercise sessions several times a day, or you could do longer exercise sessions a few times a week. For example, you could take a brisk 10-minute walk or bike ride, 3 times a day, for 5 days a week.  Do not smoke. If you need help quitting, ask your health care provider.  Limit your alcohol intake. If you drink alcohol,  limit alcohol intake to no more than 1 drink a day for nonpregnant women and 2 drinks a day for men. One drink equals 12 oz of beer, 5 oz of wine, or 1 oz of hard liquor. Why are these changes important? If you have high cholesterol, deposits (plaques) may build up on the walls of your blood vessels. Plaques make the arteries narrower and stiffer, which can restrict or block blood flow and cause blood clots to form. This greatly increases your risk for heart attack and stroke. Making diet and lifestyle changes can reduce your risk for these life-threatening conditions. What can I do to lower my risk?  Manage your risk factors for high cholesterol. Talk with your health care provider about all of your risk factors and how to lower your risk.  Manage other conditions that you have, such as diabetes or high blood pressure (hypertension).  Have your cholesterol checked at regular intervals.  Keep all follow-up visits as told by your health care provider. This is important. How is this treated? In addition to diet and lifestyle changes, your health care provider may recommend medicines to help lower cholesterol, such as a medicine to reduce the amount of cholesterol made in your liver. You may need medicine if:  Diet and lifestyle changes do not lower your cholesterol enough.  You have high cholesterol and other risk factors for heart disease or stroke.  Take over-the-counter and prescription medicines only as told by your health care provider. Where to find more information:  American Heart Association: www.heart.org/HEARTORG/Conditions/Cholesterol/Cholesterol_UCM_001089_SubHomePage.jsp  National Heart, Lung, and   Blood Institute: www.nhlbi.nih.gov/health/resources/heart/heart-cholesterol-hbc-what-html Summary  High cholesterol increases your risk for heart disease and stroke. By keeping your cholesterol level low, you can reduce your risk for these conditions.  Diet and lifestyle changes  are the most important steps in preventing high cholesterol.  Work with your health care provider to manage your risk factors, and have your blood tested regularly. This information is not intended to replace advice given to you by your health care provider. Make sure you discuss any questions you have with your health care provider. Document Released: 09/22/2015 Document Revised: 05/16/2016 Document Reviewed: 05/16/2016 Elsevier Interactive Patient Education  2018 [employer]  

## 2018-03-29 ENCOUNTER — Other Ambulatory Visit: Payer: Self-pay | Admitting: Physician Assistant

## 2018-03-29 DIAGNOSIS — F1020 Alcohol dependence, uncomplicated: Secondary | ICD-10-CM

## 2018-04-16 ENCOUNTER — Other Ambulatory Visit: Payer: Self-pay | Admitting: Internal Medicine

## 2018-04-16 DIAGNOSIS — F411 Generalized anxiety disorder: Secondary | ICD-10-CM

## 2018-04-16 DIAGNOSIS — R5383 Other fatigue: Secondary | ICD-10-CM

## 2018-05-09 ENCOUNTER — Encounter: Payer: Self-pay | Admitting: Internal Medicine

## 2018-05-09 ENCOUNTER — Ambulatory Visit: Payer: No Typology Code available for payment source | Admitting: Internal Medicine

## 2018-05-09 VITALS — BP 112/76 | HR 56 | Temp 97.3°F | Resp 16 | Ht 70.5 in | Wt 172.8 lb

## 2018-05-09 DIAGNOSIS — E559 Vitamin D deficiency, unspecified: Secondary | ICD-10-CM | POA: Diagnosis not present

## 2018-05-09 DIAGNOSIS — E782 Mixed hyperlipidemia: Secondary | ICD-10-CM | POA: Diagnosis not present

## 2018-05-09 DIAGNOSIS — R0989 Other specified symptoms and signs involving the circulatory and respiratory systems: Secondary | ICD-10-CM | POA: Diagnosis not present

## 2018-05-09 NOTE — Progress Notes (Addendum)
This very nice 27 y.o. single WM presents for 6 month follow up with HTN, HLD, Pre-Diabetes and Vitamin D Deficiency. Patient has hx/o polysubstance abuse with alcohol &opioids and had gone thru rehab and is still attending AA 2 x/week  and alleges sobriety 2 years by Nov 5th ,2019. Currently he's completed  step #12.     Patient has hx/o mildly elevated BP's in the past and is followed expectantly & BP has been controlled at home. Today's BP is at goal -112/76. Patient has had no complaints of any cardiac type chest pain, palpitations, dyspnea / orthopnea / PND, dizziness, claudication, or dependent edema.     Hyperlipidemia is not controlled with diet. Last Lipids were not at goal:  Lab Results  Component Value Date   CHOL 217 (H) 01/31/2018   HDL 37 (L) 01/31/2018   LDLCALC 141 (H) 01/31/2018   TRIG 255 (H) 01/31/2018   CHOLHDL 5.9 (H) 01/31/2018      Also, the patient has history of PreDiabetes  (A1c 5.7%/2013) and has had no symptoms of reactive hypoglycemia, diabetic polys, paresthesias or visual blurring.  Last A1c was at goal: Lab Results  Component Value Date   HGBA1C 5.5 10/18/2017      Further, the patient also has history of Vitamin D Deficiency ("23"/2012)  and supplements vitamin D without any suspected side-effects. Last vitamin D was at goal:  Lab Results  Component Value Date   VD25OH 76 10/18/2017   Current Outpatient Medications on File Prior to Visit  Medication Sig  . Ascorbic Acid (VITAMIN C PO) Take 1 tablet by mouth daily.  . Cyanocobalamin (VITAMIN B-12 PO) Take 1 tablet by mouth.  . Cholecalciferol (VITAMIN D3) 5000 units CAPS Takes 1 capsule daily  . naltrexone (DEPADE) 50 MG tablet TAKE ONE TABLET BY MOUTH ONE TIME DAILY   . sertraline (ZOLOFT) 100 MG tablet TAKE 1 TABLET BY MOUTH   DAILY FOR MOOD   No current facility-administered medications on file prior to visit.    Allergies  Allergen Reactions  . Dilaudid [Hydromorphone Hcl]    PMHx:     Past Medical History:  Diagnosis Date  . Acute alcoholic hepatitis 07/29/2016  . Asthma   . Prediabetes   . Vitamin D deficiency    Immunization History  Administered Date(s) Administered  . Influenza,inj,quad, With Preservative 08/06/2016  . PPD Test 02/19/2014, 04/29/2015, 10/18/2017  . Td 08/21/2012   Past Surgical History:  Procedure Laterality Date  . ADENOIDECTOMY    . CLOSED REDUCTION FOREARM FRACTURE Left 2005  . INGUINAL HERNIA REPAIR  2003  . TONSILLECTOMY     FHx:    Reviewed / unchanged  SHx:    Reviewed / unchanged   Systems Review:  Constitutional: Denies fever, chills, wt changes, headaches, insomnia, fatigue, night sweats, change in appetite. Eyes: Denies redness, blurred vision, diplopia, discharge, itchy, watery eyes.  ENT: Denies discharge, congestion, post nasal drip, epistaxis, sore throat, earache, hearing loss, dental pain, tinnitus, vertigo, sinus pain, snoring.  CV: Denies chest pain, palpitations, irregular heartbeat, syncope, dyspnea, diaphoresis, orthopnea, PND, claudication or edema. Respiratory: denies cough, dyspnea, DOE, pleurisy, hoarseness, laryngitis, wheezing.  Gastrointestinal: Denies dysphagia, odynophagia, heartburn, reflux, water brash, abdominal pain or cramps, nausea, vomiting, bloating, diarrhea, constipation, hematemesis, melena, hematochezia  or hemorrhoids. Genitourinary: Denies dysuria, frequency, urgency, nocturia, hesitancy, discharge, hematuria or flank pain. Musculoskeletal: Denies arthralgias, myalgias, stiffness, jt. swelling, pain, limping or strain/sprain.  Skin: Denies pruritus, rash, hives, warts, acne,  eczema or change in skin lesion(s). Neuro: No weakness, tremor, incoordination, spasms, paresthesia or pain. Psychiatric: Denies confusion, memory loss or sensory loss. Endo: Denies change in weight, skin or hair change.  Heme/Lymph: No excessive bleeding, bruising or enlarged lymph nodes.  Physical Exam  BP 112/76    Pulse (!) 56   Temp (!) 97.3 F (36.3 C)   Resp 16   Ht 5' 10.5" (1.791 m)   Wt 172 lb 12.8 oz (78.4 kg)   BMI 24.44 kg/m   Appears  well nourished, well groomed  and in no distress.  Eyes: PERRLA, EOMs, conjunctiva no swelling or erythema. Sinuses: No frontal/maxillary tenderness ENT/Mouth: EAC's clear, TM's nl w/o erythema, bulging. Nares clear w/o erythema, swelling, exudates. Oropharynx clear without erythema or exudates. Oral hygiene is good. Tongue normal, non obstructing. Hearing intact.  Neck: Supple. Thyroid not palpable. Car 2+/2+ without bruits, nodes or JVD. Chest: Respirations nl with BS clear & equal w/o rales, rhonchi, wheezing or stridor.  Cor: Heart sounds normal w/ regular rate and rhythm without sig. murmurs, gallops, clicks or rubs. Peripheral pulses normal and equal  without edema.  Abdomen: Soft & bowel sounds normal. Non-tender w/o guarding, rebound, hernias, masses or organomegaly.  Lymphatics: Unremarkable.  Musculoskeletal: Full ROM all peripheral extremities, joint stability, 5/5 strength and normal gait.  Skin: Warm, dry without exposed rashes, lesions or ecchymosis apparent.  Neuro: Cranial nerves intact, reflexes equal bilaterally. Sensory-motor testing grossly intact. Tendon reflexes grossly intact.  Pysch: Alert & oriented x 3.  Insight and judgement nl & appropriate. No ideations.  Assessment and Plan:  - Continue medication, monitor blood pressure at home.  - Continue DASH diet.  Reminder to go to the ER if any CP,  SOB, nausea, dizziness, severe HA, changes vision/speech.  1. Labile hypertension  2. Mixed hyperlipidemia       - Continue diet/meds, exercise,& lifestyle modifications.  - Continue monitor periodic cholesterol/liver & renal functions                                                                                             - Continue diet, exercise, lifestyle modifications.  - Monitor appropriate labs.  3. Vitamin D deficiency     - Continue supplementation       Discussed  regular exercise, BP monitoring, weight control to achieve/maintain BMI less than 25 and discussed med and SE's. Recommended labs to assess and monitor clinical status with further disposition pending results of labs. Over 30 minutes of exam, counseling, chart review was performed.

## 2018-05-09 NOTE — Patient Instructions (Signed)

## 2018-06-10 ENCOUNTER — Other Ambulatory Visit: Payer: Self-pay | Admitting: Internal Medicine

## 2018-06-10 DIAGNOSIS — R5383 Other fatigue: Secondary | ICD-10-CM

## 2018-06-10 DIAGNOSIS — F411 Generalized anxiety disorder: Secondary | ICD-10-CM

## 2018-07-08 ENCOUNTER — Other Ambulatory Visit: Payer: Self-pay | Admitting: Adult Health

## 2018-07-08 DIAGNOSIS — F1020 Alcohol dependence, uncomplicated: Secondary | ICD-10-CM

## 2018-10-04 ENCOUNTER — Encounter: Payer: Self-pay | Admitting: Internal Medicine

## 2018-11-01 ENCOUNTER — Encounter: Payer: Self-pay | Admitting: Internal Medicine

## 2018-11-01 NOTE — Progress Notes (Signed)
[name], [name]     Manuel Thomas, Manuel Thomas, Manuel Thomas                [address]                [place], [place] [id number] Telephone [phone] Telefax [phone] Annual  Screening/Preventative Visit  & Comprehensive Evaluation & Examination     This very nice 28 y.o. single WM presents for a Screening /Preventative Visit & comprehensive evaluation and management of multiple medical co-morbidities.  Patient has been followed for labile HTN, HLD, Prediabetes and Vitamin D Deficiency.     Patient also has hx/o polysubstance abuse with Alcohol & Opioids  And has completed a rehab program in 2017 and attends AA over 2 yrs sobriety.  He remains on Naltrexone     Patient has hx/o mildly elevated labile BP 's in the past and is monitored expectantly.  Today's BP is at goal - 122/84.  Patient denies any cardiac symptoms as chest pain, palpitations, shortness of breath, dizziness or ankle swelling.     Patient's hyperlipidemia is not controlled with diet. Last lipids were not at goal: Lab Results  Component Value Date   CHOL 235 (H) 11/02/2018   HDL 38 (L) 11/02/2018   LDLCALC 158 (H) 11/02/2018   TRIG 216 (H) 11/02/2018   CHOLHDL 6.2 (H) 11/02/2018      Patient has hx/o prediabetes(A1c 5.7% / 2013)  and patient denies reactive hypoglycemic symptoms, visual blurring, diabetic polys or paresthesias. Last A1c was at goal: Lab Results  Component Value Date   HGBA1C 5.4 11/02/2018       Finally, patient has history of Vitamin D Deficiency("23" / 2012) and last vitamin D was at goal: Lab Results  Component Value Date   VD25OH 47 11/02/2018   Current Outpatient Medications on File Prior to Visit  Medication Sig  . Ascorbic Acid (VITAMIN C PO) Take 1 tablet by mouth daily.  . Cholecalciferol (VITAMIN D3) 5000 units CAPS Takes 1 capsule daily  . Cyanocobalamin (VITAMIN B-12  PO) Take 1 tablet by mouth.  . naltrexone (DEPADE) 50 MG tablet TAKE ONE TABLET BY MOUTH ONE TIME DAILY   . sertraline (ZOLOFT) 100 MG tablet TAKE 1 TABLET BY MOUTH   DAILY FOR MOOD   No current facility-administered medications on file prior to visit.    Allergies  Allergen Reactions  . Dilaudid [Hydromorphone Hcl]    Past Medical History:  Diagnosis Date  . Acute alcoholic hepatitis 07/29/2016  . Asthma   . Prediabetes   . Vitamin D deficiency    Health Maintenance  Topic Date Due  . INFLUENZA VACCINE  04/21/2018  . TETANUS/TDAP  08/21/2022  . HIV Screening  Completed   Immunization History  Administered Date(s) Administered  . Influenza,inj,quad, With Preservative 08/06/2016  . PPD Test 02/19/2014, 04/29/2015, 10/18/2017, 11/02/2018  . Td 08/21/2012   Past Surgical History:  Procedure Laterality Date  . ADENOIDECTOMY    . CLOSED REDUCTION FOREARM FRACTURE Left 2005  . INGUINAL HERNIA REPAIR  2003  . TONSILLECTOMY     Family History  Problem Relation Age of Onset  . Heart disease Mother   . Heart attack Other   . Arthritis Other   . Hypertension Other   . Diabetes Other   . Stroke Other   . Alcohol abuse Paternal Grandfather    Social History  Socioeconomic History  . Marital status: Single  Occupational History  . Not on file  Tobacco Use  . Smoking status: Former Smoker    Types: Cigarettes    Last attempt to quit: 07/26/2016    Years since quitting: 2.2  . Smokeless tobacco: Never Used  Substance and Sexual Activity  . Alcohol use: No    Comment:Nov 2017  . Drug use: No  . Sexual activity: Not Currently    ROS Constitutional: Denies fever, chills, weight loss/gain, headaches, insomnia,  night sweats or change in appetite. Does c/o fatigue. Eyes: Denies redness, blurred vision, diplopia, discharge, itchy or watery eyes.  ENT: Denies discharge, congestion, post nasal drip, epistaxis, sore throat, earache, hearing loss, dental pain, Tinnitus,  Vertigo, Sinus pain or snoring.  Cardio: Denies chest pain, palpitations, irregular heartbeat, syncope, dyspnea, diaphoresis, orthopnea, PND, claudication or edema Respiratory: denies cough, dyspnea, DOE, pleurisy, hoarseness, laryngitis or wheezing.  Gastrointestinal: Denies dysphagia, heartburn, reflux, water brash, pain, cramps, nausea, vomiting, bloating, diarrhea, constipation, hematemesis, melena, hematochezia, jaundice or hemorrhoids Genitourinary: Denies dysuria, frequency, urgency, nocturia, hesitancy, discharge, hematuria or flank pain Musculoskeletal: Denies arthralgia, myalgia, stiffness, Jt. Swelling, pain, limp or strain/sprain. Denies Falls. Skin: Denies puritis, rash, hives, warts, acne, eczema or change in skin lesion Neuro: No weakness, tremor, incoordination, spasms, paresthesia or pain Psychiatric: Denies confusion, memory loss or sensory loss. Denies Depression. Endocrine: Denies change in weight, skin, hair change, nocturia, and paresthesia, diabetic polys, visual blurring or hyper / hypo glycemic episodes.  Heme/Lymph: No excessive bleeding, bruising or enlarged lymph nodes.  Physical Exam  BP 122/84   Pulse 74   Temp 98.2 F (36.8 C)   Ht 5' 10.5" (1.791 m)   Wt 171 lb 12.8 oz (77.9 kg)   PF 98 L/min   BMI 24.30 kg/m   General Appearance: Well nourished and well groomed and in no apparent distress.  Eyes: PERRLA, EOMs, conjunctiva no swelling or erythema, normal fundi and vessels. Sinuses: No frontal/maxillary tenderness ENT/Mouth: EACs patent / TMs  nl. Nares clear without erythema, swelling, mucoid exudates. Oral hygiene is good. No erythema, swelling, or exudate. Tongue normal, non-obstructing. Tonsils not swollen or erythematous. Hearing normal.  Neck: Supple, thyroid not palpable. No bruits, nodes or JVD. Respiratory: Respiratory effort normal.  BS equal and clear bilateral without rales, rhonci, wheezing or stridor. Cardio: Heart sounds are normal with  regular rate and rhythm and no murmurs, rubs or gallops. Peripheral pulses are normal and equal bilaterally without edema. No aortic or femoral bruits. Chest: symmetric with normal excursions and percussion.  Abdomen: Soft, with Nl bowel sounds. Nontender, no guarding, rebound, hernias, masses, or organomegaly.  Lymphatics: Non tender without lymphadenopathy.  Musculoskeletal: Full ROM all peripheral extremities, joint stability, 5/5 strength, and normal gait. Skin: Warm and dry without rashes, lesions, cyanosis, clubbing or  ecchymosis.  Neuro: Cranial nerves intact, reflexes equal bilaterally. Normal muscle tone, no cerebellar symptoms. Sensation intact.  Pysch: Alert and oriented X 3 with normal affect, insight and judgment appropriate.   Assessment and Plan  1. Annual Preventative/Screening Exam   2. Labile hypertension  - Urinalysis, Routine w reflex microscopic - Microalbumin / creatinine urine ratio - CBC with Differential/Platelet - COMPLETE METABOLIC PANEL WITH GFR - Magnesium - TSH  3. Hyperlipidemia, mixed  - Lipid panel - TSH  4. Prediabetes  - Hemoglobin A1c - Insulin, random  5. Vitamin D deficiency  - VITAMIN D 25 Hydroxyl  6. Screening for colorectal cancer  - POC Hemoccult Bld/Stl  7. Screening-pulmonary TB  - TB Skin Test  8. Fatigue  - Iron,Total/Total Iron Binding Cap - Vitamin B12 - Testosterone - CBC with Differential/Platelet - TSH  9. Medication management  - Urinalysis, Routine w reflex microscopic - Microalbumin / creatinine urine ratio - CBC with Differential/Platelet - COMPLETE METABOLIC PANEL WITH GFR - Magnesium - Lipid panel - TSH - Hemoglobin A1c - Insulin, random - VITAMIN D 25 Hydroxyl       Patient was counseled in prudent diet, weight control to achieve/maintain BMI less than 25, BP monitoring, regular exercise and medications as discussed.  Discussed med effects and SE's. Routine screening labs and tests as  requested with regular follow-up as recommended. Over 40 minutes of exam, counseling, chart review and high complex critical decision making was performed

## 2018-11-01 NOTE — Patient Instructions (Signed)

## 2018-11-02 ENCOUNTER — Ambulatory Visit: Payer: No Typology Code available for payment source | Admitting: Internal Medicine

## 2018-11-02 ENCOUNTER — Encounter: Payer: Self-pay | Admitting: Internal Medicine

## 2018-11-02 VITALS — BP 122/84 | HR 74 | Temp 98.2°F | Ht 70.5 in | Wt 171.8 lb

## 2018-11-02 DIAGNOSIS — Z0000 Encounter for general adult medical examination without abnormal findings: Secondary | ICD-10-CM

## 2018-11-02 DIAGNOSIS — Z79899 Other long term (current) drug therapy: Secondary | ICD-10-CM

## 2018-11-02 DIAGNOSIS — Z111 Encounter for screening for respiratory tuberculosis: Secondary | ICD-10-CM | POA: Diagnosis not present

## 2018-11-02 DIAGNOSIS — R7303 Prediabetes: Secondary | ICD-10-CM

## 2018-11-02 DIAGNOSIS — E559 Vitamin D deficiency, unspecified: Secondary | ICD-10-CM

## 2018-11-02 DIAGNOSIS — Z1211 Encounter for screening for malignant neoplasm of colon: Secondary | ICD-10-CM

## 2018-11-02 DIAGNOSIS — Z1212 Encounter for screening for malignant neoplasm of rectum: Secondary | ICD-10-CM

## 2018-11-02 DIAGNOSIS — R5383 Other fatigue: Secondary | ICD-10-CM

## 2018-11-02 DIAGNOSIS — R0989 Other specified symptoms and signs involving the circulatory and respiratory systems: Secondary | ICD-10-CM

## 2018-11-02 DIAGNOSIS — Z0001 Encounter for general adult medical examination with abnormal findings: Secondary | ICD-10-CM

## 2018-11-02 DIAGNOSIS — E782 Mixed hyperlipidemia: Secondary | ICD-10-CM

## 2018-11-03 LAB — CBC WITH DIFFERENTIAL/PLATELET
Absolute Monocytes: 622 {cells}/uL (ref 200–950)
Basophils Absolute: 59 {cells}/uL (ref 0–200)
Basophils Relative: 0.7 %
Eosinophils Absolute: 176 {cells}/uL (ref 15–500)
Eosinophils Relative: 2.1 %
HCT: 45.3 % (ref 38.5–50.0)
Hemoglobin: 15.3 g/dL (ref 13.2–17.1)
Lymphs Abs: 2839 {cells}/uL (ref 850–3900)
MCH: 29.3 pg (ref 27.0–33.0)
MCHC: 33.8 g/dL (ref 32.0–36.0)
MCV: 86.6 fL (ref 80.0–100.0)
MPV: 11.8 fL (ref 7.5–12.5)
Monocytes Relative: 7.4 %
Neutro Abs: 4704 {cells}/uL (ref 1500–7800)
Neutrophils Relative %: 56 %
Platelets: 273 10*3/uL (ref 140–400)
RBC: 5.23 Million/uL (ref 4.20–5.80)
RDW: 12.8 % (ref 11.0–15.0)
Total Lymphocyte: 33.8 %
WBC: 8.4 10*3/uL (ref 3.8–10.8)

## 2018-11-03 LAB — HEMOGLOBIN A1C
Hgb A1c MFr Bld: 5.4 % of total Hgb (ref <5.7)
Mean Plasma Glucose: 108 (calc)
eAG (mmol/L): 6 (calc)

## 2018-11-03 LAB — URINALYSIS, ROUTINE W REFLEX MICROSCOPIC
BACTERIA UA: NONE SEEN /HPF
Bilirubin Urine: NEGATIVE
Glucose, UA: NEGATIVE
Hgb urine dipstick: NEGATIVE
Hyaline Cast: NONE SEEN /LPF
Ketones, ur: NEGATIVE
Nitrite: NEGATIVE
PROTEIN: NEGATIVE
RBC / HPF: NONE SEEN /HPF (ref 0–2)
SPECIFIC GRAVITY, URINE: 1.01 (ref 1.001–1.03)
SQUAMOUS EPITHELIAL / LPF: NONE SEEN /HPF (ref <=5)
WBC, UA: NONE SEEN /HPF (ref 0–5)
pH: 7 (ref 5.0–8.0)

## 2018-11-03 LAB — VITAMIN B12: Vitamin B-12: 1659 pg/mL — ABNORMAL HIGH (ref 200–1100)

## 2018-11-03 LAB — COMPLETE METABOLIC PANEL WITH GFR
AG Ratio: 1.8 (calc) (ref 1.0–2.5)
ALT: 11 U/L (ref 9–46)
AST: 12 U/L (ref 10–40)
Albumin: 4.8 g/dL (ref 3.6–5.1)
Alkaline phosphatase (APISO): 71 U/L (ref 36–130)
BUN: 10 mg/dL (ref 7–25)
CALCIUM: 9.8 mg/dL (ref 8.6–10.3)
CO2: 27 mmol/L (ref 20–32)
Chloride: 101 mmol/L (ref 98–110)
Creat: 1.01 mg/dL (ref 0.60–1.35)
GFR, Est African American: 118 mL/min/{1.73_m2} (ref >=60)
GFR, Est Non African American: 101 mL/min/{1.73_m2} (ref >=60)
Globulin: 2.6 g/dL (calc) (ref 1.9–3.7)
Glucose, Bld: 87 mg/dL (ref 65–99)
Potassium: 4.2 mmol/L (ref 3.5–5.3)
Sodium: 138 mmol/L (ref 135–146)
Total Bilirubin: 0.4 mg/dL (ref 0.2–1.2)
Total Protein: 7.4 g/dL (ref 6.1–8.1)

## 2018-11-03 LAB — INSULIN, RANDOM: Insulin: 5.4 u[IU]/mL (ref 2.0–19.6)

## 2018-11-03 LAB — MICROALBUMIN / CREATININE URINE RATIO
Creatinine, Urine: 79 mg/dL (ref 20–320)
Microalb Creat Ratio: 3 ug/mg{creat}
Microalb, Ur: 0.2 mg/dL

## 2018-11-03 LAB — LIPID PANEL
Cholesterol: 235 mg/dL — ABNORMAL HIGH (ref <200)
HDL: 38 mg/dL — ABNORMAL LOW (ref >=40)
LDL Cholesterol (Calc): 158 mg/dL (calc) — ABNORMAL HIGH
Non-HDL Cholesterol (Calc): 197 mg/dL (calc) — ABNORMAL HIGH (ref <130)
Total CHOL/HDL Ratio: 6.2 (calc) — ABNORMAL HIGH (ref <5.0)
Triglycerides: 216 mg/dL — ABNORMAL HIGH (ref <150)

## 2018-11-03 LAB — MAGNESIUM: Magnesium: 2.1 mg/dL (ref 1.5–2.5)

## 2018-11-03 LAB — IRON, TOTAL/TOTAL IRON BINDING CAP
%SAT: 28 % (ref 20–48)
Iron: 97 ug/dL (ref 50–195)
TIBC: 349 ug/dL (ref 250–425)

## 2018-11-03 LAB — TSH: TSH: 1.94 m[IU]/L (ref 0.40–4.50)

## 2018-11-03 LAB — TESTOSTERONE: Testosterone: 345 ng/dL (ref 250–827)

## 2018-11-03 LAB — VITAMIN D 25 HYDROXY (VIT D DEFICIENCY, FRACTURES): Vit D, 25-Hydroxy: 47 ng/mL (ref 30–100)

## 2018-11-05 ENCOUNTER — Encounter: Payer: Self-pay | Admitting: Internal Medicine

## 2018-11-27 ENCOUNTER — Other Ambulatory Visit: Payer: Self-pay | Admitting: Internal Medicine

## 2018-11-27 DIAGNOSIS — R5383 Other fatigue: Secondary | ICD-10-CM

## 2018-11-27 DIAGNOSIS — F411 Generalized anxiety disorder: Secondary | ICD-10-CM

## 2019-01-11 ENCOUNTER — Other Ambulatory Visit: Payer: Self-pay | Admitting: Internal Medicine

## 2019-01-11 DIAGNOSIS — F1020 Alcohol dependence, uncomplicated: Secondary | ICD-10-CM

## 2019-04-18 ENCOUNTER — Other Ambulatory Visit: Payer: Self-pay | Admitting: Internal Medicine

## 2019-04-18 DIAGNOSIS — F1020 Alcohol dependence, uncomplicated: Secondary | ICD-10-CM

## 2019-05-08 NOTE — Progress Notes (Signed)
3 Month Follow Up   Assessment and Plan:  Manuel Thomas was seen today for follow-up.  Diagnoses and all orders for this visit:  Labile hypertension No medications, continue to monitor Monitor blood pressure at home; call if consistently over 130/80 Continue DASH diet.   Reminder to go to the ER if any CP, SOB, nausea, dizziness, severe HA, changes vision/speech, left arm numbness and tingling and jaw pain. -     CBC with Differential/Platelet -     COMPLETE METABOLIC PANEL WITH GFR  Mixed hyperlipidemia Discussed dietary and exercise modifications -     Lipid panel  Vitamin D deficiency Not taking supplement at this time -     VITAMIN D 25 Hydroxy (Vit-D Deficiency, Fractures)  Anxiety  Doing well at this time -continue medications, stress management techniques discussed, increase water, good sleep hygiene discussed, increase exercise, and increase veggies.  Continue to monitor  Family history of alcoholism in paternal grandfather  Family history of alcoholism in father  Medication management Continued -     CBC with Differential/Platelet -     COMPLETE METABOLIC PANEL WITH GFR -     Lipid panel -     VITAMIN D 25 Hydroxy (Vit-D Deficiency, Fractures)    Continue diet and meds as discussed. Further disposition pending results of labs. Discussed med's effects and SE's.  Patient agrees with plan of care and opportunity to ask questions/voice concerns. Over 30 minutes of chart review, interview, exam, counseling, and critical decision making was performed.   Future Appointments  Date Time Provider [place]  05/09/2019  2:30 PM Garnet Sierras, NP [facility] None  11/22/2019  9:00 AM Unk Pinto, MD [facility] None    ----------------------------------------------------------------------------------------------------------------------  HPI 28 y.o. male  presents for 3 month follow up on labile HTN, HLD,weight and vitamin D deficiency.  He has history of  anxiety on zoloft and doing well.  Patient also has hx/o polysubstance abuse with Alcohol & Opioids with completion of a rehab program in 2017 and attends AA.  He remains on Naltrexone and denies any side effects from this.   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  Reports some increase stress with work related to [place] but not consistant and reports he is dealing with this.  He lives alone but reports he has friends and family he keeps in touch with.  He is sexually active, monogamous, male, relationship.  He denies any concern or exposure to STD's.  He denies any purposful exercise although he reports he like to "go hunting" with his metal detector for 2-3 hours when he has free time and this consists of walking.     BMI is There is no height or weight on file to calculate BMI., he has been working on diet and exercise. Wt Readings from Last 3 Encounters:  11/02/18 171 lb 12.8 oz (77.9 kg)  05/09/18 172 lb 12.8 oz (78.4 kg)  01/31/18 171 lb (77.6 kg)    His blood pressure has been controlled at home, today their BP is    He does workout. He denies any cardiac symptoms, chest pains, palpitations, shortness of breath, dizziness or lower extremity edema.     He is not on cholesterol medication  and denies myalgias. His cholesterol is not at goal. The cholesterol last visit was:   Lab Results  Component Value Date   CHOL 235 (H) 11/02/2018   HDL 38 (L) 11/02/2018   LDLCALC 158 (H) 11/02/2018  TRIG 216 (H) 11/02/2018   CHOLHDL 6.2 (H) 11/02/2018    He has been working on diet and exercise for prediabetes (A1c 5.7%, 2013), and denies polydipsia, polyuria, visual disturbances, vomiting and weight loss. Last A1C in the office was:  Lab Results  Component Value Date   HGBA1C 5.4 11/02/2018   Patient has Vitamin D Defciency (23, 2012).  He is taking Vitamin D supplement.   Lab Results  Component Value Date   VD25OH 47 11/02/2018       Current Medications:   Current Outpatient Medications on File Prior to Visit  Medication Sig  . Ascorbic Acid (VITAMIN C PO) Take 1 tablet by mouth daily.  . Cholecalciferol (VITAMIN D3) 5000 units CAPS Takes 1 capsule daily  . Cyanocobalamin (VITAMIN B-12 PO) Take 1 tablet by mouth.  . naltrexone (DEPADE) 50 MG tablet Take 1 tablet Daily for Alcohol Cravings  . sertraline (ZOLOFT) 100 MG tablet TAKE 1 TABLET BY MOUTH   DAILY FOR MOOD   No current facility-administered medications on file prior to visit.     Allergies:  Allergies  Allergen Reactions  . Dilaudid [Hydromorphone Hcl]      Medical History:  Past Medical History:  Diagnosis Date  . Acute alcoholic hepatitis 07/29/2016  . Asthma   . Prediabetes   . Vitamin D deficiency     Family history- Reviewed and unchanged   Social history- Reviewed and unchanged   Names of Other Physician/Practitioners you currently use: 1. [facility] here for primary care 2. Eye Exam: Due for 2020 3. Dental Exam Due for 2020  Patient Care Team: Lucky CowboyMcKeown, William, MD as PCP - General (Internal Medicine) Frederico Hammanaffrey, Daniel, MD as Consulting Physician (Orthopedic Surgery)   Screening Tests: Immunization History  Administered Date(s) Administered  . Influenza,inj,quad, With Preservative 08/06/2016  . PPD Test 02/19/2014, 04/29/2015, 10/18/2017, 11/02/2018  . Td 08/21/2012     Vaccinations: TD or Tdap: 2013  Influenza: Due for 2020    Review of Systems:  Review of Systems  Constitutional: Negative for chills, diaphoresis, fever, malaise/fatigue and weight loss.  HENT: Negative for congestion, ear discharge, ear pain, hearing loss, nosebleeds, sinus pain, sore throat and tinnitus.   Eyes: Negative for blurred vision, double vision, photophobia, pain, discharge and redness.  Respiratory: Negative for cough, hemoptysis, sputum production, shortness of breath, wheezing and stridor.   Cardiovascular: Negative for  chest pain, palpitations, orthopnea, claudication, leg swelling and PND.  Gastrointestinal: Negative for abdominal pain, blood in stool, constipation, diarrhea, heartburn, melena, nausea and vomiting.  Genitourinary: Negative for dysuria, flank pain, frequency, hematuria and urgency.  Musculoskeletal: Negative for back pain, falls, joint pain, myalgias and neck pain.  Skin: Negative for itching and rash.  Neurological: Negative for dizziness, tingling, tremors, sensory change, speech change, focal weakness, seizures, loss of consciousness, weakness and headaches.  Endo/Heme/Allergies: Negative for environmental allergies and polydipsia. Does not bruise/bleed easily.  Psychiatric/Behavioral: Negative for depression, hallucinations, memory loss, substance abuse and suicidal ideas. The patient is not nervous/anxious and does not have insomnia.       Physical Exam: There were no vitals taken for this visit. Wt Readings from Last 3 Encounters:  11/02/18 171 lb 12.8 oz (77.9 kg)  05/09/18 172 lb 12.8 oz (78.4 kg)  01/31/18 171 lb (77.6 kg)   General Appearance: Well nourished, in no apparent distress. Eyes: PERRLA, EOMs, conjunctiva no swelling or erythema Sinuses: No Frontal/maxillary tenderness ENT/Mouth: Ext aud canals clear, TMs without erythema, bulging.  No erythema, swelling, or exudate on post pharynx.  Tonsils not swollen or erythematous. Hearing normal.  Neck: Supple, thyroid normal.  Respiratory: Respiratory effort normal, BS equal bilaterally without rales, rhonchi, wheezing or stridor.  Cardio: RRR with no MRGs. Brisk peripheral pulses without edema.  Abdomen: Soft, + BS.  Non tender, no guarding, rebound, hernias, masses. Lymphatics: Non tender without lymphadenopathy.  Musculoskeletal: Full ROM, 5/5 strength, Normal gait Skin: Warm, dry without rashes, lesions, ecchymosis.  Neuro: Cranial nerves intact. No cerebellar symptoms.  Psych: Awake and oriented X 3, normal affect,  Insight and Judgment appropriate.    Manuel NegusKyra Juanmiguel Defelice, NP [facility] 10:33 PM

## 2019-05-09 ENCOUNTER — Ambulatory Visit: Payer: No Typology Code available for payment source | Admitting: Adult Health Nurse Practitioner

## 2019-05-09 ENCOUNTER — Encounter: Payer: Self-pay | Admitting: Adult Health Nurse Practitioner

## 2019-05-09 ENCOUNTER — Ambulatory Visit: Payer: Self-pay | Admitting: Adult Health

## 2019-05-09 ENCOUNTER — Other Ambulatory Visit: Payer: Self-pay

## 2019-05-09 VITALS — BP 112/68 | HR 74 | Temp 97.7°F | Wt 172.0 lb

## 2019-05-09 DIAGNOSIS — F411 Generalized anxiety disorder: Secondary | ICD-10-CM | POA: Diagnosis not present

## 2019-05-09 DIAGNOSIS — Z811 Family history of alcohol abuse and dependence: Secondary | ICD-10-CM

## 2019-05-09 DIAGNOSIS — R0989 Other specified symptoms and signs involving the circulatory and respiratory systems: Secondary | ICD-10-CM | POA: Diagnosis not present

## 2019-05-09 DIAGNOSIS — E559 Vitamin D deficiency, unspecified: Secondary | ICD-10-CM | POA: Diagnosis not present

## 2019-05-09 DIAGNOSIS — Z79899 Other long term (current) drug therapy: Secondary | ICD-10-CM

## 2019-05-09 DIAGNOSIS — Z6372 Alcoholism and drug addiction in family: Secondary | ICD-10-CM

## 2019-05-09 DIAGNOSIS — E782 Mixed hyperlipidemia: Secondary | ICD-10-CM

## 2019-05-09 NOTE — Patient Instructions (Addendum)
We will contact you via phone call with your lab results in 1-3 days.  Keep up the good work with your sobriety, 28 months is a huge accomplishment!   It was nice to meet you today.  Please let us know if we can assist you with your health.    Vit D  & Vit C 1,000 mg   are recommended to help protect  against the Covid-19 and other Corona viruses.    Also it's recommended  to take  Zinc 50 mg  to help  protect against the Covid-19   and best place to get  is also on [email or web address]  and don't pay more than 6-8 cents /pill !  ================================= Coronavirus (COVID-19) Are you at risk?  Are you at risk for the Coronavirus (COVID-19)?  To be considered HIGH RISK for Coronavirus (COVID-19), you have to meet the following criteria:  . Traveled to [place], [place], [place], [place] or [place]; or in the [place] to [place], [place], [place]  . or [place]; and have fever, cough, and shortness of breath within the last 2 weeks of travel OR . Been in close contact with a person diagnosed with COVID-19 within the last 2 weeks and have  . fever, cough,and shortness of breath .  . IF YOU DO NOT MEET THESE CRITERIA, YOU ARE CONSIDERED LOW RISK FOR COVID-19.  What to do if you are HIGH RISK for COVID-19?  Marland Kitchen If you are having a medical emergency, call 911. . Seek medical care right away. Before you go to a doctor's office, urgent care or emergency department, .  call ahead and tell them about your recent travel, contact with someone diagnosed with COVID-19  .  and your symptoms.  . You should receive instructions from your physician's office regarding next steps of care.  . When you arrive at healthcare provider, tell the healthcare staff immediately you have returned from  . visiting [place], [place], [place], [place] or [place]; or traveled in the [place] to [place], [place],  . [place] or [place] in the last two weeks or you have been in close contact  with a person diagnosed with  . COVID-19 in the last 2 weeks.   . Tell the health care staff about your symptoms: fever, cough and shortness of breath. . After you have been seen by a medical provider, you will be either: o Tested for (COVID-19) and discharged home on quarantine except to seek medical care if  o symptoms worsen, and asked to  - Stay home and avoid contact with others until you get your results (4-5 days)  - Avoid travel on public transportation if possible (such as bus, train, or airplane) or o Sent to the Emergency Department by EMS for evaluation, COVID-19 testing  and  o possible admission depending on your condition and test results.  What to do if you are LOW RISK for COVID-19?  Reduce your risk of any infection by using the same precautions used for avoiding the common cold or flu:  Marland Kitchen Wash your hands often with soap and warm water for at least 20 seconds.  If soap and water are not readily available,  . use an alcohol-based hand sanitizer with at least 60% alcohol.  . If coughing or sneezing, cover your mouth and nose by coughing or sneezing into the elbow areas of your shirt or coat, .  into a tissue or into your sleeve (not your hands). Marland Kitchen  Avoid shaking hands with others and consider head nods or verbal greetings only. . Avoid touching your eyes, nose, or mouth with unwashed hands.  . Avoid close contact with people who are sick. . Avoid places or events with large numbers of people in one location, like concerts or sporting events. . Carefully consider travel plans you have or are making. . If you are planning any travel outside or inside the [place], visit the CDC's Travelers' Health webpage for the latest health notices. . If you have some symptoms but not all symptoms, continue to monitor at home and seek medical attention  . if your symptoms worsen. . If you are having a medical emergency, call 911. >>>>>>>>>>>>>>>>>>>>>>>> Preventive Care for Adults  A healthy  lifestyle and preventive care can promote health and wellness. Preventive health guidelines for men include the following key practices:  A routine yearly physical is a good way to check with your health care provider about your health and preventative screening. It is a chance to share any concerns and updates on your health and to receive a thorough exam.  Visit your dentist for a routine exam and preventative care every 6 months. Brush your teeth twice a day and floss once a day. Good oral hygiene prevents tooth decay and gum disease.  The frequency of eye exams is based on your age, health, family medical history, use of contact lenses, and other factors. Follow your health care provider's recommendations for frequency of eye exams.  Eat a healthy diet. Foods such as vegetables, fruits, whole grains, low-fat dairy products, and lean protein foods contain the nutrients you need without too many calories. Decrease your intake of foods high in solid fats, added sugars, and salt. Eat the right amount of calories for you. Get information about a proper diet from your health care provider, if necessary.  Regular physical exercise is one of the most important things you can do for your health. Most adults should get at least 150 minutes of moderate-intensity exercise (any activity that increases your heart rate and causes you to sweat) each week. In addition, most adults need muscle-strengthening exercises on 2 or more days a week.  Maintain a healthy weight. The body mass index (BMI) is a screening tool to identify possible weight problems. It provides an estimate of body fat based on height and weight. Your health care provider can find your BMI and can help you achieve or maintain a healthy weight. For adults 20 years and older:  A BMI below 18.5 is considered underweight.  A BMI of 18.5 to 24.9 is normal.  A BMI of 25 to 29.9 is considered overweight.  A BMI of 30 and above is considered  obese.  Maintain normal blood lipids and cholesterol levels by exercising and minimizing your intake of saturated fat. Eat a balanced diet with plenty of fruit and vegetables. Blood tests for lipids and cholesterol should begin at age 40 and be repeated every 5 years. If your lipid or cholesterol levels are high, you are over 50, or you are at high risk for heart disease, you may need your cholesterol levels checked more frequently. Ongoing high lipid and cholesterol levels should be treated with medicines if diet and exercise are not working.  If you smoke, find out from your health care provider how to quit. If you do not use tobacco, do not start.  Lung cancer screening is recommended for adults aged 72-80 years who are at high risk for developing  lung cancer because of a history of smoking. A yearly low-dose CT scan of the lungs is recommended for people who have at least a 30-pack-year history of smoking and are a current smoker or have quit within the past 15 years. A pack year of smoking is smoking an average of 1 pack of cigarettes a day for 1 year (for example: 1 pack a day for 30 years or 2 packs a day for 15 years). Yearly screening should continue until the smoker has stopped smoking for at least 15 years. Yearly screening should be stopped for people who develop a health problem that would prevent them from having lung cancer treatment.  If you choose to drink alcohol, do not have more than 2 drinks per day. One drink is considered to be 12 ounces (355 mL) of beer, 5 ounces (148 mL) of wine, or 1.5 ounces (44 mL) of liquor.  High blood pressure causes heart disease and increases the risk of stroke. Your blood pressure should be checked. Ongoing high blood pressure should be treated with medicines, if weight loss and exercise are not effective.  If you are 6-50 years old, ask your health care provider if you should take aspirin to prevent heart disease.  Diabetes screening involves taking  a blood sample to check your fasting blood sugar level. Testing should be considered at a younger age or be carried out more frequently if you are overweight and have at least 1 risk factor for diabetes.  Colorectal cancer can be detected and often prevented. Most routine colorectal cancer screening begins at the age of 66 and continues through age 19. However, your health care provider may recommend screening at an earlier age if you have risk factors for colon cancer. On a yearly basis, your health care provider may provide home test kits to check for hidden blood in the stool. Use of a small camera at the end of a tube to directly examine the colon (sigmoidoscopy or colonoscopy) can detect the earliest forms of colorectal cancer. Talk to your health care provider about this at age 7, when routine screening begins. Direct exam of the colon should be repeated every 5-10 years through age 62, unless early forms of precancerous polyps or small growths are found.  Screening for abdominal aortic aneurysm (AAA)  are recommended for persons over age 14 who have history of hypertensionor who are current or former smokers.  Talk with your health care provider about prostate cancer screening.  Testicular cancer screening is recommended for adult males. Screening includes self-exam, a health care provider exam, and other screening tests. Consult with your health care provider about any symptoms you have or any concerns you have about testicular cancer.  Use sunscreen. Apply sunscreen liberally and repeatedly throughout the day. You should seek shade when your shadow is shorter than you. Protect yourself by wearing long sleeves, pants, a wide-brimmed hat, and sunglasses year round, whenever you are outdoors.  Once a month, do a whole-body skin exam, using a mirror to look at the skin on your back. Tell your health care provider about new moles, moles that have irregular borders, moles that are larger than a  pencil eraser, or moles that have changed in shape or color.  Stay current with required vaccines (immunizations).  Influenza vaccine. All adults should be immunized every year.  Tetanus, diphtheria, and acellular pertussis (Td, Tdap) vaccine. An adult who has not previously received Tdap or who does not know his vaccine status should receive  1 dose of Tdap. This initial dose should be followed by tetanus and diphtheria toxoids (Td) booster doses every 10 years. Adults with an unknown or incomplete history of completing a 3-dose immunization series with Td-containing vaccines should begin or complete a primary immunization series including a Tdap dose. Adults should receive a Td booster every 10 years.  Zoster vaccine. One dose is recommended for adults aged 43 years or older unless certain conditions are present.    Pneumococcal 13-valent conjugate (PCV13) vaccine. When indicated, a person who is uncertain of his immunization history and has no record of immunization should receive the PCV13 vaccine. An adult aged 71 years or older who has certain medical conditions and has not been previously immunized should receive 1 dose of PCV13 vaccine. This PCV13 should be followed with a dose of pneumococcal polysaccharide (PPSV23) vaccine. The PPSV23 vaccine dose should be obtained at least 8 weeks after the dose of PCV13 vaccine. An adult aged 53 years or older who has certain medical conditions and previously received 1 or more doses of PPSV23 vaccine should receive 1 dose of PCV13. The PCV13 vaccine dose should be obtained 1 or more years after the last PPSV23 vaccine dose.    Pneumococcal polysaccharide (PPSV23) vaccine. When PCV13 is also indicated, PCV13 should be obtained first. All adults aged 66 years and older should be immunized. An adult younger than age 53 years who has certain medical conditions should be immunized. Any person who resides in a nursing home or long-term care facility should be  immunized. An adult smoker should be immunized. People with an immunocompromised condition and certain other conditions should receive both PCV13 and PPSV23 vaccines. People with human immunodeficiency virus (HIV) infection should be immunized as soon as possible after diagnosis. Immunization during chemotherapy or radiation therapy should be avoided. Routine use of PPSV23 vaccine is not recommended for American Indians, [place] Natives, or people younger than 65 years unless there are medical conditions that require PPSV23 vaccine. When indicated, people who have unknown immunization and have no record of immunization should receive PPSV23 vaccine. One-time revaccination 5 years after the first dose of PPSV23 is recommended for people aged 19-64 years who have chronic kidney failure, nephrotic syndrome, asplenia, or immunocompromised conditions. People who received 1-2 doses of PPSV23 before age 84 years should receive another dose of PPSV23 vaccine at age 84 years or later if at least 5 years have passed since the previous dose. Doses of PPSV23 are not needed for people immunized with PPSV23 at or after age 27 years.  Hepatitis A vaccine. Adults who wish to be protected from this disease, have certain high-risk conditions, work with hepatitis A-infected animals, work in hepatitis A research labs, or travel to or work in countries with a high rate of hepatitis A should be immunized. Adults who were previously unvaccinated and who anticipate close contact with an international adoptee during the first 60 days after arrival in the [place] from a country with a high rate of hepatitis A should be immunized.  Hepatitis B vaccine. Adults should be immunized if they wish to be protected from this disease, have certain high-risk conditions, may be exposed to blood or other infectious body fluids, are household contacts or sex partners of hepatitis B positive people, are clients or workers in certain care  facilities, or travel to or work in countries with a high rate of hepatitis B.  Preventive Service / Frequency  Ages 49 to 85  Blood pressure check.  Lipid and cholesterol check.  Hepatitis C blood test.** / For any individual with known risks for hepatitis C.  Skin self-exam. / Monthly.  Influenza vaccine. / Every year.  Tetanus, diphtheria, and acellular pertussis (Tdap, Td) vaccine.** / Consult your health care provider. 1 dose of Td every 10 years.  HPV vaccine. / 3 doses over 6 months, if 54 or younger.  Measles, mumps, rubella (MMR) vaccine.** / You need at least 1 dose of MMR if you were born in 1957 or later. You may also need a second dose.  Pneumococcal 13-valent conjugate (PCV13) vaccine.** / Consult your health care provider.  Pneumococcal polysaccharide (PPSV23) vaccine.** / 1 to 2 doses if you smoke cigarettes or if you have certain conditions.  Meningococcal vaccine.** / 1 dose if you are age 55 to 64 years and a Market researcher living in a residence hall, or have one of several medical conditions. You may also need additional booster doses.  Hepatitis A vaccine.** / Consult your health care provider.  Hepatitis B vaccine.** / Consult your health care provider. +++++++++ Recommend Adult Low Dose Aspirin or  coated  Aspirin 81 mg daily  To reduce risk of Colon Cancer 40 %,  Skin Cancer 26 % ,  Melanoma 46%  and  Pancreatic cancer 60% ++++++++++++++++++ Vitamin D goal  is between 70-100.  Please make sure that you are taking your Vitamin D as directed.  It is very important as a natural anti-inflammatory  helping hair, skin, and nails, as well as reducing stroke and heart attack risk.  It helps your bones and helps with mood. It also decreases numerous cancer risks so please take it as directed.  Low Vit D is associated with a 200-300% higher risk for CANCER  and 200-300% higher risk for HEART   ATTACK  &  STROKE.    .....................................Marland Kitchen It is also associated with higher death rate at younger ages,  autoimmune diseases like Rheumatoid arthritis, Lupus, Multiple Sclerosis.    Also many other serious conditions, like depression, Alzheimer's Dementia, infertility, muscle aches, fatigue, fibromyalgia - just to name a few. +++++++++++++++++++++ Recommend the book "The END of DIETING" by Dr Excell Seltzer  & the book "The END of DIABETES " by Dr Excell Seltzer At [email or web address] - get book & Audio CD's    Being diabetic has a  300% increased risk for heart attack, stroke, cancer, and alzheimer- type vascular dementia. It is very important that you work harder with diet by avoiding all foods that are white. Avoid white rice (brown & wild rice is OK), white potatoes (sweetpotatoes in moderation is OK), White bread or wheat bread or anything made out of white flour like bagels, donuts, rolls, buns, biscuits, cakes, pastries, cookies, pizza crust, and pasta (made from white flour & egg whites) - vegetarian pasta or spinach or wheat pasta is OK. Multigrain breads like Arnold's or [employer], or multigrain sandwich thins or flatbreads.  Diet, exercise and weight loss can reverse and cure diabetes in the early stages.  Diet, exercise and weight loss is very important in the control and prevention of complications of diabetes which affects every system in your body, ie. Brain - dementia/stroke, eyes - glaucoma/blindness, heart - heart attack/heart failure, kidneys - dialysis, stomach - gastric paralysis, intestines - malabsorption, nerves - severe painful neuritis, circulation - gangrene & loss of a leg(s), and finally cancer and Alzheimers.    I recommend avoid fried & greasy foods,  sweets/candy, white rice (brown  or wild rice or Quinoa is OK), white potatoes (sweet potatoes are OK) - anything made from white flour - bagels, doughnuts, rolls, buns, biscuits,white and wheat breads, pizza crust and traditional pasta  made of white flour & egg white(vegetarian pasta or spinach or wheat pasta is OK).  Multi-grain bread is OK - like multi-grain flat bread or sandwich thins. Avoid alcohol in excess. Exercise is also important.    Eat all the vegetables you want - avoid meat, especially red meat and dairy - especially cheese.  Cheese is the most concentrated form of trans-fats which is the worst thing to clog up our arteries. Veggie cheese is OK which can be found in the fresh produce section at [employer] or [employer] or [employer]  +++++++++++++++++++ DASH Eating Plan  DASH stands for "Dietary Approaches to Stop Hypertension."   The DASH eating plan is a healthy eating plan that has been shown to reduce high blood pressure (hypertension). Additional health benefits may include reducing the risk of type 2 diabetes mellitus, heart disease, and stroke. The DASH eating plan may also help with weight loss. WHAT DO I NEED TO KNOW ABOUT THE DASH EATING PLAN? For the DASH eating plan, you will follow these general guidelines:  Choose foods with a percent daily value for sodium of less than 5% (as listed on the food label).  Use salt-free seasonings or herbs instead of table salt or sea salt.  Check with your health care provider or pharmacist before using salt substitutes.  Eat lower-sodium products, often labeled as "lower sodium" or "no salt added."  Eat fresh foods.  Eat more vegetables, fruits, and low-fat dairy products.  Choose whole grains. Look for the word "whole" as the first word in the ingredient list.  Choose fish   Limit sweets, desserts, sugars, and sugary drinks.  Choose heart-healthy fats.  Eat veggie cheese   Eat more home-cooked food and less restaurant, buffet, and fast food.  Limit fried foods.  Cook foods using methods other than frying.  Limit canned vegetables. If you do use them, rinse them well to decrease the sodium.  When eating at a restaurant, ask that your food  be prepared with less salt, or no salt if possible.                      WHAT FOODS CAN I EAT? Read Dr Fara Olden Fuhrman's books on The End of Dieting & The End of Diabetes  Grains Whole grain or whole wheat bread. Brown rice. Whole grain or whole wheat pasta. Quinoa, bulgur, and whole grain cereals. Low-sodium cereals. Corn or whole wheat flour tortillas. Whole grain cornbread. Whole grain crackers. Low-sodium crackers.  Vegetables Fresh or frozen vegetables (raw, steamed, roasted, or grilled). Low-sodium or reduced-sodium tomato and vegetable juices. Low-sodium or reduced-sodium tomato sauce and paste. Low-sodium or reduced-sodium canned vegetables.   Fruits All fresh, canned (in natural juice), or frozen fruits.  Protein Products  All fish and seafood.  Dried beans, peas, or lentils. Unsalted nuts and seeds. Unsalted canned beans.  Dairy Low-fat dairy products, such as skim or 1% milk, 2% or reduced-fat cheeses, low-fat ricotta or cottage cheese, or plain low-fat yogurt. Low-sodium or reduced-sodium cheeses.  Fats and Oils Tub margarines without trans fats. Light or reduced-fat mayonnaise and salad dressings (reduced sodium). Avocado. Safflower, olive, or canola oils. Natural peanut or almond butter.  Other Unsalted popcorn and pretzels. The items listed above may not be a complete list of  recommended foods or beverages. Contact your dietitian for more options.  +++++++++++++++++++  WHAT FOODS ARE NOT RECOMMENDED? Grains/ White flour or wheat flour White bread. White pasta. White rice. Refined cornbread. Bagels and croissants. Crackers that contain trans fat.  Vegetables  Creamed or fried vegetables. Vegetables in a . Regular canned vegetables. Regular canned tomato sauce and paste. Regular tomato and vegetable juices.  Fruits Dried fruits. Canned fruit in light or heavy syrup. Fruit juice.  Meat and Other Protein Products Meat in general - RED meat & White meat.  Fatty cuts of  meat. Ribs, chicken wings, all processed meats as bacon, sausage, bologna, salami, fatback, hot dogs, bratwurst and packaged luncheon meats.  Dairy Whole or 2% milk, cream, half-and-half, and cream cheese. Whole-fat or sweetened yogurt. Full-fat cheeses or blue cheese. Non-dairy creamers and whipped toppings. Processed cheese, cheese spreads, or cheese curds.  Condiments Onion and garlic salt, seasoned salt, table salt, and sea salt. Canned and packaged gravies. Worcestershire sauce. Tartar sauce. Barbecue sauce. Teriyaki sauce. Soy sauce, including reduced sodium. Steak sauce. Fish sauce. Oyster sauce. Cocktail sauce. Horseradish. Ketchup and mustard. Meat flavorings and tenderizers. Bouillon cubes. Hot sauce. Tabasco sauce. Marinades. Taco seasonings. Relishes.  Fats and Oils Butter, stick margarine, lard, shortening and bacon fat. Coconut, palm kernel, or palm oils. Regular salad dressings.  Pickles and olives. Salted popcorn and pretzels.  The items listed above may not be a complete list of foods and beverages to avoid.

## 2019-05-10 LAB — COMPLETE METABOLIC PANEL WITH GFR
AG Ratio: 1.7 (calc) (ref 1.0–2.5)
ALT: 10 U/L (ref 9–46)
AST: 11 U/L (ref 10–40)
Albumin: 4.5 g/dL (ref 3.6–5.1)
Alkaline phosphatase (APISO): 71 U/L (ref 36–130)
BUN: 8 mg/dL (ref 7–25)
CO2: 26 mmol/L (ref 20–32)
Calcium: 9.8 mg/dL (ref 8.6–10.3)
Chloride: 103 mmol/L (ref 98–110)
Creat: 0.93 mg/dL (ref 0.60–1.35)
GFR, Est African American: 130 mL/min/{1.73_m2} (ref >=60)
GFR, Est Non African American: 112 mL/min/{1.73_m2} (ref >=60)
Globulin: 2.7 g/dL (calc) (ref 1.9–3.7)
Glucose, Bld: 83 mg/dL (ref 65–99)
Potassium: 4.1 mmol/L (ref 3.5–5.3)
Sodium: 138 mmol/L (ref 135–146)
Total Bilirubin: 0.5 mg/dL (ref 0.2–1.2)
Total Protein: 7.2 g/dL (ref 6.1–8.1)

## 2019-05-10 LAB — CBC WITH DIFFERENTIAL/PLATELET
Absolute Monocytes: 454 cells/uL (ref 200–950)
Basophils Absolute: 50 cells/uL (ref 0–200)
Basophils Relative: 0.8 %
Eosinophils Absolute: 120 cells/uL (ref 15–500)
Eosinophils Relative: 1.9 %
HCT: 43.7 % (ref 38.5–50.0)
Hemoglobin: 14.7 g/dL (ref 13.2–17.1)
Lymphs Abs: 2255 cells/uL (ref 850–3900)
MCH: 28.9 pg (ref 27.0–33.0)
MCHC: 33.6 g/dL (ref 32.0–36.0)
MCV: 86 fL (ref 80.0–100.0)
MPV: 12.1 fL (ref 7.5–12.5)
Monocytes Relative: 7.2 %
Neutro Abs: 3421 cells/uL (ref 1500–7800)
Neutrophils Relative %: 54.3 %
Platelets: 261 10*3/uL (ref 140–400)
RBC: 5.08 10*6/uL (ref 4.20–5.80)
RDW: 12.8 % (ref 11.0–15.0)
Total Lymphocyte: 35.8 %
WBC: 6.3 10*3/uL (ref 3.8–10.8)

## 2019-05-10 LAB — LIPID PANEL
Cholesterol: 218 mg/dL — ABNORMAL HIGH (ref <200)
HDL: 32 mg/dL — ABNORMAL LOW (ref >=40)
LDL Cholesterol (Calc): 138 mg/dL (calc) — ABNORMAL HIGH
Non-HDL Cholesterol (Calc): 186 mg/dL (calc) — ABNORMAL HIGH (ref <130)
Total CHOL/HDL Ratio: 6.8 (calc) — ABNORMAL HIGH (ref <5.0)
Triglycerides: 334 mg/dL — ABNORMAL HIGH (ref <150)

## 2019-05-10 LAB — VITAMIN D 25 HYDROXY (VIT D DEFICIENCY, FRACTURES): Vit D, 25-Hydroxy: 51 ng/mL (ref 30–100)

## 2019-05-16 ENCOUNTER — Other Ambulatory Visit: Payer: Self-pay | Admitting: Internal Medicine

## 2019-05-16 DIAGNOSIS — F411 Generalized anxiety disorder: Secondary | ICD-10-CM

## 2019-05-16 DIAGNOSIS — R5383 Other fatigue: Secondary | ICD-10-CM

## 2019-10-14 ENCOUNTER — Other Ambulatory Visit: Payer: Self-pay | Admitting: Internal Medicine

## 2019-10-14 DIAGNOSIS — F1020 Alcohol dependence, uncomplicated: Secondary | ICD-10-CM

## 2019-10-23 ENCOUNTER — Ambulatory Visit: Payer: No Typology Code available for payment source | Attending: Internal Medicine

## 2019-10-23 DIAGNOSIS — Z20822 Contact with and (suspected) exposure to covid-19: Secondary | ICD-10-CM

## 2019-10-24 LAB — NOVEL CORONAVIRUS, NAA: SARS-CoV-2, NAA: NOT DETECTED

## 2019-11-21 ENCOUNTER — Encounter: Payer: Self-pay | Admitting: Internal Medicine

## 2019-11-21 ENCOUNTER — Ambulatory Visit: Payer: No Typology Code available for payment source | Attending: Internal Medicine

## 2019-11-21 DIAGNOSIS — Z20822 Contact with and (suspected) exposure to covid-19: Secondary | ICD-10-CM

## 2019-11-21 NOTE — Progress Notes (Signed)
R E S C H E D U L E D   D U E     T O  C O V I D    EX P O S U RE                                  This very nice 29 y.o. single WM  presents for a Screening /Preventative Visit & comprehensive evaluation and management of multiple medical co-morbidities.  Patient has is  followed for labile HTN, HLD, Prediabetes and Vitamin D Deficiency. 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 He desires to continue Naltrexone.      Patient has prior hx/o mildly elevated BP's in the past and is proactively monitored expectantly.  Today's  . Patient denies any cardiac symptoms as chest pain, palpitations, shortness of breath, dizziness or ankle swelling.     Patient's hyperlipidemia is not controlled with diet. Last lipids were not at goal: Lab Results  Component Value Date   CHOL 218 (H) 05/09/2019   HDL 32 (L) 05/09/2019   LDLCALC 138 (H) 05/09/2019   TRIG 334 (H) 05/09/2019   CHOLHDL 6.8 (H) 05/09/2019       Patient has hx/o prediabetes (A1c 5.7% / 2013)  and hedenies reactive hypoglycemic symptoms, visual blurring, diabetic polys or paresthesias. Last A1c was  Normal & at goal:  Lab Results  Component Value Date   HGBA1C 5.4 11/02/2018        Finally,  patient has history of  Vitamin D Deficiency ("23" / 2012) and last vitamin D was still slightly low:  Lab Results  Component Value Date   VD25OH 51 05/09/2019

## 2019-11-22 ENCOUNTER — Ambulatory Visit: Payer: Self-pay | Admitting: Internal Medicine

## 2019-11-22 LAB — NOVEL CORONAVIRUS, NAA: SARS-CoV-2, NAA: NOT DETECTED

## 2019-12-07 ENCOUNTER — Ambulatory Visit: Payer: No Typology Code available for payment source | Attending: Internal Medicine

## 2019-12-07 DIAGNOSIS — Z23 Encounter for immunization: Secondary | ICD-10-CM

## 2019-12-07 NOTE — Progress Notes (Signed)
   Covid-19 Vaccination Clinic  Name:  Manuel Thomas    MRN: [id number] DOB: [birth year]  12/07/2019  Manuel Thomas was observed post Covid-19 immunization for 15 minutes without incident. He was provided with Vaccine Information Sheet and instruction to access the V-Safe system.   Manuel Thomas was instructed to call 911 with any severe reactions post vaccine: Marland Kitchen Difficulty breathing  . Swelling of face and throat  . A fast heartbeat  . A bad rash all over body  . Dizziness and weakness   Immunizations Administered    Name Date Dose VIS Date Route   Pfizer COVID-19 Vaccine 12/07/2019 10:27 AM 0.3 mL 09/01/2019 Intramuscular   Manufacturer: [employer], [employer]   Lot: [id number]   NDC: [address]-3

## 2020-01-01 ENCOUNTER — Ambulatory Visit: Payer: No Typology Code available for payment source | Attending: Internal Medicine

## 2020-01-01 DIAGNOSIS — Z23 Encounter for immunization: Secondary | ICD-10-CM

## 2020-01-01 NOTE — Progress Notes (Signed)
   Covid-19 Vaccination Clinic  Name:  Manuel Thomas    MRN: [id number] DOB: 1991-09-02  01/01/2020  [name] was observed post Covid-19 immunization for 15 minutes without incident. He was provided with Vaccine Information Sheet and instruction to access the V-Safe system.   [name] was instructed to call 911 with any severe reactions post vaccine: Marland Kitchen Difficulty breathing  . Swelling of face and throat  . A fast heartbeat  . A bad rash all over body  . Dizziness and weakness   Immunizations Administered    Name Date Dose VIS Date Route   Pfizer COVID-19 Vaccine 01/01/2020  9:50 AM 0.3 mL 09/01/2019 Intramuscular   Manufacturer: [employer], [employer]   Lot: [id number]   NDC: [address]-2

## 2020-01-08 ENCOUNTER — Other Ambulatory Visit: Payer: Self-pay

## 2020-01-08 ENCOUNTER — Encounter: Payer: Self-pay | Admitting: Internal Medicine

## 2020-01-08 ENCOUNTER — Ambulatory Visit: Payer: No Typology Code available for payment source | Admitting: Internal Medicine

## 2020-01-08 VITALS — BP 110/64 | HR 68 | Temp 97.0°F | Resp 16 | Ht 70.5 in | Wt 170.6 lb

## 2020-01-08 DIAGNOSIS — Z0000 Encounter for general adult medical examination without abnormal findings: Secondary | ICD-10-CM

## 2020-01-08 DIAGNOSIS — Z111 Encounter for screening for respiratory tuberculosis: Secondary | ICD-10-CM | POA: Diagnosis not present

## 2020-01-08 DIAGNOSIS — Z1211 Encounter for screening for malignant neoplasm of colon: Secondary | ICD-10-CM

## 2020-01-08 DIAGNOSIS — E559 Vitamin D deficiency, unspecified: Secondary | ICD-10-CM

## 2020-01-08 DIAGNOSIS — Z0001 Encounter for general adult medical examination with abnormal findings: Secondary | ICD-10-CM

## 2020-01-08 DIAGNOSIS — R0989 Other specified symptoms and signs involving the circulatory and respiratory systems: Secondary | ICD-10-CM

## 2020-01-08 DIAGNOSIS — Z1212 Encounter for screening for malignant neoplasm of rectum: Secondary | ICD-10-CM

## 2020-01-08 DIAGNOSIS — R7309 Other abnormal glucose: Secondary | ICD-10-CM

## 2020-01-08 DIAGNOSIS — R7303 Prediabetes: Secondary | ICD-10-CM

## 2020-01-08 DIAGNOSIS — Z79899 Other long term (current) drug therapy: Secondary | ICD-10-CM

## 2020-01-08 DIAGNOSIS — R5383 Other fatigue: Secondary | ICD-10-CM

## 2020-01-08 DIAGNOSIS — E782 Mixed hyperlipidemia: Secondary | ICD-10-CM

## 2020-01-08 NOTE — Progress Notes (Signed)
Annual  Screening/Preventative Visit  & Comprehensive Evaluation & Examination     This very nice 29 y.o. single WM presents for a Screening /Preventative Visit & comprehensive evaluation and management of multiple medical co-morbidities.  Patient has been followed for labile HTN, HLD, Prediabetes and Vitamin D Deficiency.                                         Patient also has hx/o polysubstance abuse with Alcohol & Opioids & has completed a rehab program in 2017 and attends AA over 4 yrs sobriety.  He remains on Naltrexone     Labile HTN predates several years. Today's BP is at goal -  110/64. Patient denies any cardiac symptoms as chest pain, palpitations, shortness of breath, dizziness or ankle swelling.     Patient's Lipids have not been  controlled with diet.  Lab Results  Component Value Date   CHOL 218 (H) 05/09/2019   HDL 32 (L) 05/09/2019   LDLCALC 138 (H) 05/09/2019   TRIG 334 (H) 05/09/2019   CHOLHDL 6.8 (H) 05/09/2019       Patient is followed expectantly for glucose intolerance (A1c 5.7% / 2013)  and patient denies reactive hypoglycemic symptoms, visual blurring, diabetic polys or paresthesias. Last A1c was Normal & at goal:  Lab Results  Component Value Date   HGBA1C 5.4 11/02/2018        Finally, patient has history of Vitamin D Deficiency("23" / 2012) and last vitamin D was still slightly low:   Lab Results  Component Value Date   VD25OH 78 05/09/2019    Current Outpatient Medications on File Prior to Visit  Medication Sig  . Ascorbic Acid (VITAMIN C PO) Take 1 tablet by mouth daily.  . Cholecalciferol (VITAMIN D3) 5000 units CAPS Takes 1 capsule daily  . Cyanocobalamin (VITAMIN B-12 PO) Take 1 tablet by mouth.  . naltrexone (DEPADE) 50 MG tablet Take 1 tablet Daily for Alcohol Cravings  . sertraline (ZOLOFT) 100 MG tablet Take 1 tablet Daily for Mood  . zinc gluconate 50 MG tablet Take 50 mg by mouth daily.   No current facility-administered  medications on file prior to visit.   Allergies  Allergen Reactions  . Dilaudid [Hydromorphone Hcl]    Past Medical History:  Diagnosis Date  . Acute alcoholic hepatitis 07/29/2016  . Asthma   . Prediabetes   . Vitamin D deficiency    Health Maintenance  Topic Date Due  . INFLUENZA VACCINE  04/21/2020  . TETANUS/TDAP  08/21/2022  . COVID-19 Vaccine  Completed  . HIV Screening  Completed   Immunization History  Administered Date(s) Administered  . Influenza,inj,quad, With Preservative 08/06/2016  . PFIZER SARS-COV-2 Vaccination 12/07/2019, 01/01/2020  . PPD Test 02/19/2014, 04/29/2015, 10/18/2017, 11/02/2018, 01/08/2020  . Td 08/21/2012    Past Surgical History:  Procedure Laterality Date  . ADENOIDECTOMY    . CLOSED REDUCTION FOREARM FRACTURE Left 2005  . INGUINAL HERNIA REPAIR  2003  . TONSILLECTOMY     Family History  Problem Relation Age of Onset  . Heart disease Mother   . Heart attack Other   . Arthritis Other   . Hypertension Other   . Diabetes Other   . Stroke Other   . Alcohol abuse Paternal Grandfather    Social History   Socioeconomic History  . Marital status: Single    Spouse name:  Not on file  . Number of children: Not on file  . Years of education: Not on file  . Highest education level: Not on file  Occupational History  . Not on file  Tobacco Use  . Smoking status: Former Smoker    Types: Cigarettes    Quit date: 07/26/2016    Years since quitting: 3.4  . Smokeless tobacco: Never Used  Substance and Sexual Activity  . Alcohol use: No    Comment: 24mo sober  . Drug use: No  . Sexual activity: Not Currently     ROS Constitutional: Denies fever, chills, weight loss/gain, headaches, insomnia,  night sweats or change in appetite. Does c/o fatigue. Eyes: Denies redness, blurred vision, diplopia, discharge, itchy or watery eyes.  ENT: Denies discharge, congestion, post nasal drip, epistaxis, sore throat, earache, hearing loss, dental pain,  Tinnitus, Vertigo, Sinus pain or snoring.  Cardio: Denies chest pain, palpitations, irregular heartbeat, syncope, dyspnea, diaphoresis, orthopnea, PND, claudication or edema Respiratory: denies cough, dyspnea, DOE, pleurisy, hoarseness, laryngitis or wheezing.  Gastrointestinal: Denies dysphagia, heartburn, reflux, water brash, pain, cramps, nausea, vomiting, bloating, diarrhea, constipation, hematemesis, melena, hematochezia, jaundice or hemorrhoids Genitourinary: Denies dysuria, frequency, urgency, nocturia, hesitancy, discharge, hematuria or flank pain Musculoskeletal: Denies arthralgia, myalgia, stiffness, Jt. Swelling, pain, limp or strain/sprain. Denies Falls. Skin: Denies puritis, rash, hives, warts, acne, eczema or change in skin lesion Neuro: No weakness, tremor, incoordination, spasms, paresthesia or pain Psychiatric: Denies confusion, memory loss or sensory loss. Denies Depression. Endocrine: Denies change in weight, skin, hair change, nocturia, and paresthesia, diabetic polys, visual blurring or hyper / hypo glycemic episodes.  Heme/Lymph: No excessive bleeding, bruising or enlarged lymph nodes.  Physical Exam  BP 110/64   Pulse 68   Temp (!) 97 F (36.1 C)   Resp 16   Ht 5' 10.5" (1.791 m)   Wt 170 lb 9.6 oz (77.4 kg)   BMI 24.13 kg/m   General Appearance: Well nourished and well groomed and in no apparent distress.  Eyes: PERRLA, EOMs, conjunctiva no swelling or erythema, normal fundi and vessels. Sinuses: No frontal/maxillary tenderness ENT/Mouth: EACs patent / TMs  nl. Nares clear without erythema, swelling, mucoid exudates. Oral hygiene is good. No erythema, swelling, or exudate. Tongue normal, non-obstructing. Tonsils not swollen or erythematous. Hearing normal.  Neck: Supple, thyroid not palpable. No bruits, nodes or JVD. Respiratory: Respiratory effort normal.  BS equal and clear bilateral without rales, rhonci, wheezing or stridor. Cardio: Heart sounds are normal  with regular rate and rhythm and no murmurs, rubs or gallops. Peripheral pulses are normal and equal bilaterally without edema. No aortic or femoral bruits. Chest: symmetric with normal excursions and percussion.  Abdomen: Soft, with Nl bowel sounds. Nontender, no guarding, rebound, hernias, masses, or organomegaly.  Lymphatics: Non tender without lymphadenopathy.  Musculoskeletal: Full ROM all peripheral extremities, joint stability, 5/5 strength, and normal gait. Skin: Warm and dry without rashes, lesions, cyanosis, clubbing or  ecchymosis.  Neuro: Cranial nerves intact, reflexes equal bilaterally. Normal muscle tone, no cerebellar symptoms. Sensation intact.  Pysch: Alert and oriented X 3 with normal affect, insight and judgment appropriate.   Assessment and Plan  1. Annual Preventative/Screening Exam   2. Labile hypertension  - Urinalysis, Routine w reflex microscopic - CBC with Differential/Platelet - COMPLETE METABOLIC PANEL WITH GFR - Magnesium - TSH  3. Hyperlipidemia, mixed  - Urinalysis, Routine w reflex microscopic - Lipid panel - TSH  4. Abnormal glucose  - Urinalysis, Routine w reflex microscopic -  Hemoglobin A1c - Insulin, random  5. Vitamin D deficiency  - VITAMIN D 25 Hydroxy  6. Prediabetes  - Hemoglobin A1c - Insulin, random  7. Screening for colorectal cancer  - POC Hemoccult Bld/Stl   8. Screening-pulmonary TB  - TB Skin Test  9. Fatigue  - Iron,Total/Total Iron Binding Cap - Vitamin B12 - Testosterone - CBC with Differential/Platelet - COMPLETE METABOLIC PANEL WITH GFR - TSH  10. Medication management  - Urinalysis, Routine w reflex microscopic - Microalbumin / creatinine urine ratio - CBC with Differential/Platelet - COMPLETE METABOLIC PANEL WITH GFR - Magnesium - Lipid panel - TSH - Hemoglobin A1c - Insulin, random - VITAMIN D 25 Hydroxy        Patient was counseled in prudent diet, weight control to achieve/maintain BMI  less than 25, BP monitoring, regular exercise and medications as discussed.  Discussed med effects and SE's. Routine screening labs and tests as requested with regular follow-up as recommended. Over 40 minutes of exam, counseling, chart review and critical decision making was performed  Kirtland Bouchard, MD

## 2020-01-08 NOTE — Patient Instructions (Signed)
Due to recent changes in healthcare laws, you may see the results of your imaging and laboratory studies on MyChart before your provider has had a chance to review them.  We understand that in some cases there may be results that are confusing or concerning to you. Not all laboratory results come back in the same time frame and the provider may be waiting for multiple results in order to interpret others.  Please give [place] 48 hours in order for your provider to thoroughly review all the results before contacting the office for clarification of your results.   +++++++++++++++++++++++++++++++++  Vit D  & Vit C 1,000 mg   are recommended to help protect  against the Covid-19 and other Corona viruses.    Also it's recommended  to take  Zinc 50 mg  to help  protect against the Covid-19   and best place to get  is also on [email or web address]  and don't pay more than 6-8 cents /pill !  ================================= Coronavirus (COVID-19) Are you at risk?  Are you at risk for the Coronavirus (COVID-19)?  To be considered HIGH RISK for Coronavirus (COVID-19), you have to meet the following criteria:  . Traveled to [place], [place], [place], [place] or [place]; or in the [place] to [place], [place], [place]  . or [place]; and have fever, cough, and shortness of breath within the last 2 weeks of travel OR . Been in close contact with a person diagnosed with COVID-19 within the last 2 weeks and have  . fever, cough,and shortness of breath .  . IF YOU DO NOT MEET THESE CRITERIA, YOU ARE CONSIDERED LOW RISK FOR COVID-19.  What to do if you are HIGH RISK for COVID-19?  Marland Kitchen If you are having a medical emergency, call 911. . Seek medical care right away. Before you go to a doctor's office, urgent care or emergency department, .  call ahead and tell them about your recent travel, contact with someone diagnosed with COVID-19  .  and your symptoms.  . You should receive instructions from your  physician's office regarding next steps of care.  . When you arrive at healthcare provider, tell the healthcare staff immediately you have returned from  . visiting [place], [place], [place], [place] or [place]; or traveled in the [place] to [place], [place],  . [place] or [place] in the last two weeks or you have been in close contact with a person diagnosed with  . COVID-19 in the last 2 weeks.   . Tell the health care staff about your symptoms: fever, cough and shortness of breath. . After you have been seen by a medical provider, you will be either: o Tested for (COVID-19) and discharged home on quarantine except to seek medical care if  o symptoms worsen, and asked to  - Stay home and avoid contact with others until you get your results (4-5 days)  - Avoid travel on public transportation if possible (such as bus, train, or airplane) or o Sent to the Emergency Department by EMS for evaluation, COVID-19 testing  and  o possible admission depending on your condition and test results.  What to do if you are LOW RISK for COVID-19?  Reduce your risk of any infection by using the same precautions used for avoiding the common cold or flu:  Marland Kitchen Wash your hands often with soap and warm water for at least 20 seconds.  If soap and water are not readily  available,  . use an alcohol-based hand sanitizer with at least 60% alcohol.  . If coughing or sneezing, cover your mouth and nose by coughing or sneezing into the elbow areas of your shirt or coat, .  into a tissue or into your sleeve (not your hands). . Avoid shaking hands with others and consider head nods or verbal greetings only. . Avoid touching your eyes, nose, or mouth with unwashed hands.  . Avoid close contact with people who are sick. . Avoid places or events with large numbers of people in one location, like concerts or sporting events. . Carefully consider travel plans you have or are making. . If you are planning any travel  outside or inside the [place], visit the CDC's Travelers' Health webpage for the latest health notices. . If you have some symptoms but not all symptoms, continue to monitor at home and seek medical attention  . if your symptoms worsen. . If you are having a medical emergency, call 911. >>>>>>>>>>>>>>>>>>>>>>>> Preventive Care for Adults  A healthy lifestyle and preventive care can promote health and wellness. Preventive health guidelines for men include the following key practices:  A routine yearly physical is a good way to check with your health care provider about your health and preventative screening. It is a chance to share any concerns and updates on your health and to receive a thorough exam.  Visit your dentist for a routine exam and preventative care every 6 months. Brush your teeth twice a day and floss once a day. Good oral hygiene prevents tooth decay and gum disease.  The frequency of eye exams is based on your age, health, family medical history, use of contact lenses, and other factors. Follow your health care provider's recommendations for frequency of eye exams.  Eat a healthy diet. Foods such as vegetables, fruits, whole grains, low-fat dairy products, and lean protein foods contain the nutrients you need without too many calories. Decrease your intake of foods high in solid fats, added sugars, and salt. Eat the right amount of calories for you. Get information about a proper diet from your health care provider, if necessary.  Regular physical exercise is one of the most important things you can do for your health. Most adults should get at least 150 minutes of moderate-intensity exercise (any activity that increases your heart rate and causes you to sweat) each week. In addition, most adults need muscle-strengthening exercises on 2 or more days a week.  Maintain a healthy weight. The body mass index (BMI) is a screening tool to identify possible weight problems. It provides an  estimate of body fat based on height and weight. Your health care provider can find your BMI and can help you achieve or maintain a healthy weight. For adults 20 years and older:  A BMI below 18.5 is considered underweight.  A BMI of 18.5 to 24.9 is normal.  A BMI of 25 to 29.9 is considered overweight.  A BMI of 30 and above is considered obese.  Maintain normal blood lipids and cholesterol levels by exercising and minimizing your intake of saturated fat. Eat a balanced diet with plenty of fruit and vegetables. Blood tests for lipids and cholesterol should begin at age 45 and be repeated every 5 years. If your lipid or cholesterol levels are high, you are over 50, or you are at high risk for heart disease, you may need your cholesterol levels checked more frequently. Ongoing high lipid and cholesterol levels should be treated  with medicines if diet and exercise are not working.  If you smoke, find out from your health care provider how to quit. If you do not use tobacco, do not start.  Lung cancer screening is recommended for adults aged 2-80 years who are at high risk for developing lung cancer because of a history of smoking. A yearly low-dose CT scan of the lungs is recommended for people who have at least a 30-pack-year history of smoking and are a current smoker or have quit within the past 15 years. A pack year of smoking is smoking an average of 1 pack of cigarettes a day for 1 year (for example: 1 pack a day for 30 years or 2 packs a day for 15 years). Yearly screening should continue until the smoker has stopped smoking for at least 15 years. Yearly screening should be stopped for people who develop a health problem that would prevent them from having lung cancer treatment.  If you choose to drink alcohol, do not have more than 2 drinks per day. One drink is considered to be 12 ounces (355 mL) of beer, 5 ounces (148 mL) of wine, or 1.5 ounces (44 mL) of liquor.  High blood pressure  causes heart disease and increases the risk of stroke. Your blood pressure should be checked. Ongoing high blood pressure should be treated with medicines, if weight loss and exercise are not effective.  If you are 35-59 years old, ask your health care provider if you should take aspirin to prevent heart disease.  Diabetes screening involves taking a blood sample to check your fasting blood sugar level. Testing should be considered at a younger age or be carried out more frequently if you are overweight and have at least 1 risk factor for diabetes.  Colorectal cancer can be detected and often prevented. Most routine colorectal cancer screening begins at the age of 50 and continues through age 67. However, your health care provider may recommend screening at an earlier age if you have risk factors for colon cancer. On a yearly basis, your health care provider may provide home test kits to check for hidden blood in the stool. Use of a small camera at the end of a tube to directly examine the colon (sigmoidoscopy or colonoscopy) can detect the earliest forms of colorectal cancer. Talk to your health care provider about this at age 75, when routine screening begins. Direct exam of the colon should be repeated every 5-10 years through age 50, unless early forms of precancerous polyps or small growths are found.  Screening for abdominal aortic aneurysm (AAA)  are recommended for persons over age 31 who have history of hypertensionor who are current or former smokers.  Talk with your health care provider about prostate cancer screening.  Testicular cancer screening is recommended for adult males. Screening includes self-exam, a health care provider exam, and other screening tests. Consult with your health care provider about any symptoms you have or any concerns you have about testicular cancer.  Use sunscreen. Apply sunscreen liberally and repeatedly throughout the day. You should seek shade when your shadow  is shorter than you. Protect yourself by wearing long sleeves, pants, a wide-brimmed hat, and sunglasses year round, whenever you are outdoors.  Once a month, do a whole-body skin exam, using a mirror to look at the skin on your back. Tell your health care provider about new moles, moles that have irregular borders, moles that are larger than a pencil eraser, or moles that  have changed in shape or color.  Stay current with required vaccines (immunizations).  Influenza vaccine. All adults should be immunized every year.  Tetanus, diphtheria, and acellular pertussis (Td, Tdap) vaccine. An adult who has not previously received Tdap or who does not know his vaccine status should receive 1 dose of Tdap. This initial dose should be followed by tetanus and diphtheria toxoids (Td) booster doses every 10 years. Adults with an unknown or incomplete history of completing a 3-dose immunization series with Td-containing vaccines should begin or complete a primary immunization series including a Tdap dose. Adults should receive a Td booster every 10 years.  Zoster vaccine. One dose is recommended for adults aged 60 years or older unless certain conditions are present.    Pneumococcal 13-valent conjugate (PCV13) vaccine. When indicated, a person who is uncertain of his immunization history and has no record of immunization should receive the PCV13 vaccine. An adult aged 19 years or older who has certain medical conditions and has not been previously immunized should receive 1 dose of PCV13 vaccine. This PCV13 should be followed with a dose of pneumococcal polysaccharide (PPSV23) vaccine. The PPSV23 vaccine dose should be obtained at least 8 weeks after the dose of PCV13 vaccine. An adult aged 19 years or older who has certain medical conditions and previously received 1 or more doses of PPSV23 vaccine should receive 1 dose of PCV13. The PCV13 vaccine dose should be obtained 1 or more years after the last PPSV23  vaccine dose.    Pneumococcal polysaccharide (PPSV23) vaccine. When PCV13 is also indicated, PCV13 should be obtained first. All adults aged 65 years and older should be immunized. An adult younger than age 65 years who has certain medical conditions should be immunized. Any person who resides in a nursing home or long-term care facility should be immunized. An adult smoker should be immunized. People with an immunocompromised condition and certain other conditions should receive both PCV13 and PPSV23 vaccines. People with human immunodeficiency virus (HIV) infection should be immunized as soon as possible after diagnosis. Immunization during chemotherapy or radiation therapy should be avoided. Routine use of PPSV23 vaccine is not recommended for American Indians, [place] Natives, or people younger than 65 years unless there are medical conditions that require PPSV23 vaccine. When indicated, people who have unknown immunization and have no record of immunization should receive PPSV23 vaccine. One-time revaccination 5 years after the first dose of PPSV23 is recommended for people aged 19-64 years who have chronic kidney failure, nephrotic syndrome, asplenia, or immunocompromised conditions. People who received 1-2 doses of PPSV23 before age 65 years should receive another dose of PPSV23 vaccine at age 65 years or later if at least 5 years have passed since the previous dose. Doses of PPSV23 are not needed for people immunized with PPSV23 at or after age 65 years.  Hepatitis A vaccine. Adults who wish to be protected from this disease, have certain high-risk conditions, work with hepatitis A-infected animals, work in hepatitis A research labs, or travel to or work in countries with a high rate of hepatitis A should be immunized. Adults who were previously unvaccinated and who anticipate close contact with an international adoptee during the first 60 days after arrival in the [place] from a country with a  high rate of hepatitis A should be immunized.  Hepatitis B vaccine. Adults should be immunized if they wish to be protected from this disease, have certain high-risk conditions, may be exposed to blood or other infectious   body fluids, are household contacts or sex partners of hepatitis B positive people, are clients or workers in certain care facilities, or travel to or work in countries with a high rate of hepatitis B.  Preventive Service / Frequency  Ages 19 to 28  Blood pressure check.  Lipid and cholesterol check.  Hepatitis C blood test.** / For any individual with known risks for hepatitis C.  Skin self-exam. / Monthly.  Influenza vaccine. / Every year.  Tetanus, diphtheria, and acellular pertussis (Tdap, Td) vaccine.** / Consult your health care provider. 1 dose of Td every 10 years.  HPV vaccine. / 3 doses over 6 months, if 49 or younger.  Measles, mumps, rubella (MMR) vaccine.** / You need at least 1 dose of MMR if you were born in 1957 or later. You may also need a second dose.  Pneumococcal 13-valent conjugate (PCV13) vaccine.** / Consult your health care provider.  Pneumococcal polysaccharide (PPSV23) vaccine.** / 1 to 2 doses if you smoke cigarettes or if you have certain conditions.  Meningococcal vaccine.** / 1 dose if you are age 33 to 88 years and a Market researcher living in a residence hall, or have one of several medical conditions. You may also need additional booster doses.  Hepatitis A vaccine.** / Consult your health care provider.  Hepatitis B vaccine.** / Consult your health care provider. +++++++++ Recommend Adult Low Dose Aspirin or  coated  Aspirin 81 mg daily  To reduce risk of Colon Cancer 40 %,  Skin Cancer 26 % ,  Melanoma 46%  and  Pancreatic cancer 60% ++++++++++++++++++ Vitamin D goal  is between 70-100.  Please make sure that you are taking your Vitamin D as directed.  It is very important as a natural anti-inflammatory   helping hair, skin, and nails, as well as reducing stroke and heart attack risk.  It helps your bones and helps with mood. It also decreases numerous cancer risks so please take it as directed.  Low Vit D is associated with a 200-300% higher risk for CANCER  and 200-300% higher risk for HEART   ATTACK  &  STROKE.   .....................................Marland Kitchen It is also associated with higher death rate at younger ages,  autoimmune diseases like Rheumatoid arthritis, Lupus, Multiple Sclerosis.    Also many other serious conditions, like depression, Alzheimer's Dementia, infertility, muscle aches, fatigue, fibromyalgia - just to name a few. +++++++++++++++++++++ Recommend the book "The END of DIETING" by Dr Excell Seltzer  & the book "The END of DIABETES " by Dr Excell Seltzer At [email or web address] - get book & Audio CD's    Being diabetic has a  300% increased risk for heart attack, stroke, cancer, and alzheimer- type vascular dementia. It is very important that you work harder with diet by avoiding all foods that are white. Avoid white rice (brown & wild rice is OK), white potatoes (sweetpotatoes in moderation is OK), White bread or wheat bread or anything made out of white flour like bagels, donuts, rolls, buns, biscuits, cakes, pastries, cookies, pizza crust, and pasta (made from white flour & egg whites) - vegetarian pasta or spinach or wheat pasta is OK. Multigrain breads like Arnold's or [employer], or multigrain sandwich thins or flatbreads.  Diet, exercise and weight loss can reverse and cure diabetes in the early stages.  Diet, exercise and weight loss is very important in the control and prevention of complications of diabetes which affects every system in your body, ie. Brain - dementia/stroke, eyes -  glaucoma/blindness, heart - heart attack/heart failure, kidneys - dialysis, stomach - gastric paralysis, intestines - malabsorption, nerves - severe painful neuritis, circulation - gangrene & loss of a  leg(s), and finally cancer and Alzheimers.    I recommend avoid fried & greasy foods,  sweets/candy, white rice (brown or wild rice or Quinoa is OK), white potatoes (sweet potatoes are OK) - anything made from white flour - bagels, doughnuts, rolls, buns, biscuits,white and wheat breads, pizza crust and traditional pasta made of white flour & egg white(vegetarian pasta or spinach or wheat pasta is OK).  Multi-grain bread is OK - like multi-grain flat bread or sandwich thins. Avoid alcohol in excess. Exercise is also important.    Eat all the vegetables you want - avoid meat, especially red meat and dairy - especially cheese.  Cheese is the most concentrated form of trans-fats which is the worst thing to clog up our arteries. Veggie cheese is OK which can be found in the fresh produce section at [employer] or [employer] or [employer]  +++++++++++++++++++ DASH Eating Plan  DASH stands for "Dietary Approaches to Stop Hypertension."   The DASH eating plan is a healthy eating plan that has been shown to reduce high blood pressure (hypertension). Additional health benefits may include reducing the risk of type 2 diabetes mellitus, heart disease, and stroke. The DASH eating plan may also help with weight loss. WHAT DO I NEED TO KNOW ABOUT THE DASH EATING PLAN? For the DASH eating plan, you will follow these general guidelines:  Choose foods with a percent daily value for sodium of less than 5% (as listed on the food label).  Use salt-free seasonings or herbs instead of table salt or sea salt.  Check with your health care provider or pharmacist before using salt substitutes.  Eat lower-sodium products, often labeled as "lower sodium" or "no salt added."  Eat fresh foods.  Eat more vegetables, fruits, and low-fat dairy products.  Choose whole grains. Look for the word "whole" as the first word in the ingredient list.  Choose fish   Limit sweets, desserts, sugars, and sugary drinks.  Choose  heart-healthy fats.  Eat veggie cheese   Eat more home-cooked food and less restaurant, buffet, and fast food.  Limit fried foods.  Cook foods using methods other than frying.  Limit canned vegetables. If you do use them, rinse them well to decrease the sodium.  When eating at a restaurant, ask that your food be prepared with less salt, or no salt if possible.                      WHAT FOODS CAN I EAT? Read Dr Fara Olden Fuhrman's books on The End of Dieting & The End of Diabetes  Grains Whole grain or whole wheat bread. Brown rice. Whole grain or whole wheat pasta. Quinoa, bulgur, and whole grain cereals. Low-sodium cereals. Corn or whole wheat flour tortillas. Whole grain cornbread. Whole grain crackers. Low-sodium crackers.  Vegetables Fresh or frozen vegetables (raw, steamed, roasted, or grilled). Low-sodium or reduced-sodium tomato and vegetable juices. Low-sodium or reduced-sodium tomato sauce and paste. Low-sodium or reduced-sodium canned vegetables.   Fruits All fresh, canned (in natural juice), or frozen fruits.  Protein Products  All fish and seafood.  Dried beans, peas, or lentils. Unsalted nuts and seeds. Unsalted canned beans.  Dairy Low-fat dairy products, such as skim or 1% milk, 2% or reduced-fat cheeses, low-fat ricotta or cottage cheese, or plain low-fat yogurt. Low-sodium  or reduced-sodium cheeses.  Fats and Oils Tub margarines without trans fats. Light or reduced-fat mayonnaise and salad dressings (reduced sodium). Avocado. Safflower, olive, or canola oils. Natural peanut or almond butter.  Other Unsalted popcorn and pretzels. The items listed above may not be a complete list of recommended foods or beverages. Contact your dietitian for more options.  +++++++++++++++++++  WHAT FOODS ARE NOT RECOMMENDED? Grains/ White flour or wheat flour White bread. White pasta. White rice. Refined cornbread. Bagels and croissants. Crackers that contain trans  fat.  Vegetables  Creamed or fried vegetables. Vegetables in a . Regular canned vegetables. Regular canned tomato sauce and paste. Regular tomato and vegetable juices.  Fruits Dried fruits. Canned fruit in light or heavy syrup. Fruit juice.  Meat and Other Protein Products Meat in general - RED meat & White meat.  Fatty cuts of meat. Ribs, chicken wings, all processed meats as bacon, sausage, bologna, salami, fatback, hot dogs, bratwurst and packaged luncheon meats.  Dairy Whole or 2% milk, cream, half-and-half, and cream cheese. Whole-fat or sweetened yogurt. Full-fat cheeses or blue cheese. Non-dairy creamers and whipped toppings. Processed cheese, cheese spreads, or cheese curds.  Condiments Onion and garlic salt, seasoned salt, table salt, and sea salt. Canned and packaged gravies. Worcestershire sauce. Tartar sauce. Barbecue sauce. Teriyaki sauce. Soy sauce, including reduced sodium. Steak sauce. Fish sauce. Oyster sauce. Cocktail sauce. Horseradish. Ketchup and mustard. Meat flavorings and tenderizers. Bouillon cubes. Hot sauce. Tabasco sauce. Marinades. Taco seasonings. Relishes.  Fats and Oils Butter, stick margarine, lard, shortening and bacon fat. Coconut, palm kernel, or palm oils. Regular salad dressings.  Pickles and olives. Salted popcorn and pretzels.  The items listed above may not be a complete list of foods and beverages to avoid.

## 2020-01-09 LAB — CBC WITH DIFFERENTIAL/PLATELET
Absolute Monocytes: 270 cells/uL (ref 200–950)
Basophils Absolute: 28 cells/uL (ref 0–200)
Basophils Relative: 0.5 %
Eosinophils Absolute: 121 cells/uL (ref 15–500)
Eosinophils Relative: 2.2 %
HCT: 45.8 % (ref 38.5–50.0)
Hemoglobin: 14.9 g/dL (ref 13.2–17.1)
Lymphs Abs: 1524 cells/uL (ref 850–3900)
MCH: 28.5 pg (ref 27.0–33.0)
MCHC: 32.5 g/dL (ref 32.0–36.0)
MCV: 87.6 fL (ref 80.0–100.0)
MPV: 12.3 fL (ref 7.5–12.5)
Monocytes Relative: 4.9 %
Neutro Abs: 3559 cells/uL (ref 1500–7800)
Neutrophils Relative %: 64.7 %
Platelets: 266 10*3/uL (ref 140–400)
RBC: 5.23 10*6/uL (ref 4.20–5.80)
RDW: 12.7 % (ref 11.0–15.0)
Total Lymphocyte: 27.7 %
WBC: 5.5 10*3/uL (ref 3.8–10.8)

## 2020-01-09 LAB — MICROALBUMIN / CREATININE URINE RATIO
Creatinine, Urine: 175 mg/dL (ref 20–320)
Microalb Creat Ratio: 3 mcg/mg creat (ref <30)
Microalb, Ur: 0.6 mg/dL

## 2020-01-09 LAB — URINALYSIS, ROUTINE W REFLEX MICROSCOPIC
Bilirubin Urine: NEGATIVE
Glucose, UA: NEGATIVE
Hgb urine dipstick: NEGATIVE
Ketones, ur: NEGATIVE
Leukocytes,Ua: NEGATIVE
Nitrite: NEGATIVE
Protein, ur: NEGATIVE
Specific Gravity, Urine: 1.022 (ref 1.001–1.03)
pH: 6.5 (ref 5.0–8.0)

## 2020-01-09 LAB — COMPLETE METABOLIC PANEL WITH GFR
AG Ratio: 1.8 (calc) (ref 1.0–2.5)
ALT: 11 U/L (ref 9–46)
AST: 12 U/L (ref 10–40)
Albumin: 4.4 g/dL (ref 3.6–5.1)
Alkaline phosphatase (APISO): 61 U/L (ref 36–130)
BUN: 8 mg/dL (ref 7–25)
CO2: 29 mmol/L (ref 20–32)
Calcium: 9.5 mg/dL (ref 8.6–10.3)
Chloride: 103 mmol/L (ref 98–110)
Creat: 0.92 mg/dL (ref 0.60–1.35)
GFR, Est African American: 131 mL/min/{1.73_m2} (ref >=60)
GFR, Est Non African American: 113 mL/min/{1.73_m2} (ref >=60)
Globulin: 2.4 g/dL (calc) (ref 1.9–3.7)
Glucose, Bld: 145 mg/dL — ABNORMAL HIGH (ref 65–99)
Potassium: 4.5 mmol/L (ref 3.5–5.3)
Sodium: 139 mmol/L (ref 135–146)
Total Bilirubin: 0.5 mg/dL (ref 0.2–1.2)
Total Protein: 6.8 g/dL (ref 6.1–8.1)

## 2020-01-09 LAB — LIPID PANEL
Cholesterol: 202 mg/dL — ABNORMAL HIGH (ref <200)
HDL: 33 mg/dL — ABNORMAL LOW (ref >=40)
LDL Cholesterol (Calc): 138 mg/dL (calc) — ABNORMAL HIGH
Non-HDL Cholesterol (Calc): 169 mg/dL (calc) — ABNORMAL HIGH (ref <130)
Total CHOL/HDL Ratio: 6.1 (calc) — ABNORMAL HIGH (ref <5.0)
Triglycerides: 168 mg/dL — ABNORMAL HIGH (ref <150)

## 2020-01-09 LAB — IRON, TOTAL/TOTAL IRON BINDING CAP
%SAT: 36 % (calc) (ref 20–48)
Iron: 120 ug/dL (ref 50–195)
TIBC: 331 mcg/dL (calc) (ref 250–425)

## 2020-01-09 LAB — VITAMIN D 25 HYDROXY (VIT D DEFICIENCY, FRACTURES): Vit D, 25-Hydroxy: 39 ng/mL (ref 30–100)

## 2020-01-09 LAB — TSH: TSH: 0.86 mIU/L (ref 0.40–4.50)

## 2020-01-09 LAB — MAGNESIUM: Magnesium: 2.1 mg/dL (ref 1.5–2.5)

## 2020-01-09 LAB — TESTOSTERONE: Testosterone: 393 ng/dL (ref 250–827)

## 2020-01-09 LAB — VITAMIN B12: Vitamin B-12: 1875 pg/mL — ABNORMAL HIGH (ref 200–1100)

## 2020-01-09 LAB — HEMOGLOBIN A1C
Hgb A1c MFr Bld: 5.3 % of total Hgb (ref <5.7)
Mean Plasma Glucose: 105 (calc)
eAG (mmol/L): 5.8 (calc)

## 2020-01-09 LAB — INSULIN, RANDOM: Insulin: 53.8 u[IU]/mL — ABNORMAL HIGH

## 2020-02-26 ENCOUNTER — Ambulatory Visit: Payer: No Typology Code available for payment source | Admitting: Physician Assistant

## 2020-02-26 ENCOUNTER — Encounter: Payer: Self-pay | Admitting: Physician Assistant

## 2020-02-26 ENCOUNTER — Other Ambulatory Visit: Payer: Self-pay

## 2020-02-26 VITALS — BP 122/84 | HR 63 | Temp 98.2°F | Ht 70.5 in | Wt 169.0 lb

## 2020-02-26 DIAGNOSIS — L03012 Cellulitis of left finger: Secondary | ICD-10-CM

## 2020-02-26 MED ORDER — SULFAMETHOXAZOLE-TRIMETHOPRIM 800-160 MG PO TABS: 1.0000 | ORAL_TABLET | Freq: Two times a day (BID) | ORAL | 0 refills | Status: DC

## 2020-02-26 NOTE — Progress Notes (Signed)
° °  Subjective:    Patient ID: Manuel Thomas, male    DOB: 06/18/1991, 29 y.o.   MRN: [id number]  HPI 29 y.o. left handed WM presents for poss infection of left thumb x 1 week.  He has soaked in epson salt, cleaned with hydrogen and peroxide. And taking ibuprofen and tylenol. No discharge, is improving some but still very sensitive. No fever, no chills.    Blood pressure 122/84, pulse 63, temperature 98.2 F (36.8 C), height 5' 10.5" (1.791 m), weight 169 lb (76.7 kg), SpO2 97 %.  Medications      Current Outpatient Medications (Hematological):    Cyanocobalamin (VITAMIN B-12 PO), Take 1 tablet by mouth.  Current Outpatient Medications (Other):    Ascorbic Acid (VITAMIN C PO), Take 1 tablet by mouth daily.   Cholecalciferol (VITAMIN D3) 5000 units CAPS, Takes 1 capsule daily   naltrexone (DEPADE) 50 MG tablet, Take 1 tablet Daily for Alcohol Cravings   sertraline (ZOLOFT) 100 MG tablet, Take 1 tablet Daily for Mood   zinc gluconate 50 MG tablet, Take 50 mg by mouth daily.  Problem list He has Asthma; Vitamin D deficiency; Labile hypertension; Mixed hyperlipidemia; Alcohol use disorder, severe, dependence (HCC); Cannabis use disorder, severe, dependence (HCC); Anxiety neurosis; Substance induced mood disorder (HCC); Family history of alcoholism in paternal grandfather; and Family history of alcoholism in father on their problem list.   Review of Systems See HPI    Objective:   Physical Exam Constitutional:      General: He is not in acute distress.    Appearance: Normal appearance. He is not ill-appearing.  Cardiovascular:     Rate and Rhythm: Normal rate and regular rhythm.  Pulmonary:     Effort: Pulmonary effort is normal.     Breath sounds: Normal breath sounds.  Musculoskeletal:     Cervical back: Normal range of motion and neck supple.     Comments: Left thumb with hypergranulation, swelling, erythema along medial left thumb nail. No fluctuance. No streaking.  Good cap refill and sensation.   Skin:    Capillary Refill: Capillary refill takes less than 2 seconds.        Assessment & Plan:  Amedio was seen today for acute visit.  Diagnoses and all orders for this visit:  Abscess around fingernail of left hand  Other orders -     sulfamethoxazole-trimethoprim (BACTRIM DS) 800-160 MG tablet; Take 1 tablet by mouth 2 (two) times daily.  PROCEDURE NOTE: Ingrown nail debride and I&D Verbal Consent Obtained. Left thumb wiped with alcohol prep pad, then digital block with 2cc of 1% lidocaine  left medial nail lifted and debrided with serosanguineous discharge, nail left intact.  Ingrown tissue debrided. No active bleeding. Dressing applied. Cleansed and dressed.  FOLLOW UP 1 WEEK TO SEE IF NAIL NEEDS TO BE REMOVED

## 2020-02-26 NOTE — Patient Instructions (Addendum)
Take the bactrim Leave dressing on tonight Can wash/dry and put vaseline/dressing or bandaid on tomorrow Follow up 1 week May have to remove the nail but I think it was just infection and hypergranulation.    Paronychia Paronychia is an infection of the skin that surrounds a nail. It usually affects the skin around a fingernail, but it may also occur near a toenail. It often causes pain and swelling around the nail. In some cases, a collection of pus (abscess) can form near or under the nail.  This condition may develop suddenly, or it may develop gradually over a longer period. In most cases, paronychia is not serious, and it will clear up with treatment. What are the causes? This condition may be caused by bacteria or a fungus. These germs can enter the body through an opening in the skin, such as a cut or a hangnail. What increases the risk? This condition is more likely to develop in people who:  Get their hands wet often, such as those who work as Designer, industrial/product, bartenders, or nurses.  Bite their fingernails or suck their thumbs.  Trim their nails very short.  Have hangnails or injured fingertips.  Get manicures.  Have diabetes. What are the signs or symptoms? Symptoms of this condition include:  Redness and swelling of the skin near the nail.  Tenderness around the nail when you touch the area.  Pus-filled bumps under the skin at the base and sides of the nail (cuticle).  Fluid or pus under the nail.  Throbbing pain in the area. How is this diagnosed? This condition is diagnosed with a physical exam. In some cases, a sample of pus may be tested to determine what type of bacteria or fungus is causing the condition. How is this treated? Treatment depends on the cause and severity of your condition. If your condition is mild, it may clear up on its own in a few days or after soaking in warm water. If needed, treatment may include:  Antibiotic medicine, if your infection is  caused by bacteria.  Antifungal medicine, if your infection is caused by a fungus.  A procedure to drain pus from an abscess.  Anti-inflammatory medicine (corticosteroids). Follow these instructions at home: Wound care  Keep the affected area clean.  Soak the affected area in warm water, if told to do so by your health care provider. You may be told to do this for 20 minutes, 2-3 times a day.  Keep the area dry when you are not soaking it.  Do not try to drain an abscess yourself.  Follow instructions from your health care provider about how to take care of the affected area. Make sure you: ? Wash your hands with soap and water before you change your bandage (dressing). If soap and water are not available, use hand sanitizer. ? Change your dressing as told by your health care provider.  If you had an abscess drained, check the area every day for signs of infection. Check for: ? Redness, swelling, or pain. ? Fluid or blood. ? Warmth. ? Pus or a bad smell. Medicines   Take over-the-counter and prescription medicines only as told by your health care provider.  If you were prescribed an antibiotic medicine, take it as told by your health care provider. Do not stop taking the antibiotic even if you start to feel better. General instructions  Avoid contact with harsh chemicals.  Do not pick at the affected area. Prevention  To prevent this condition  from happening again: ? Wear rubber gloves when washing dishes or doing other tasks that require your hands to get wet. ? Wear gloves if your hands might come in contact with cleaners or other chemicals. ? Avoid injuring your nails or fingertips. ? Do not bite your nails or tear hangnails. ? Do not cut your nails very short. ? Do not cut your cuticles. ? Use clean nail clippers or scissors when trimming nails. Contact a health care provider if:  Your symptoms get worse or do not improve with treatment.  You have continued or  increased fluid, blood, or pus coming from the affected area.  Your finger or knuckle becomes swollen or difficult to move. Get help right away if you have:  A fever or chills.  Redness spreading away from the affected area.  Joint or muscle pain. Summary  Paronychia is an infection of the skin that surrounds a nail. It often causes pain and swelling around the nail. In some cases, a collection of pus (abscess) can form near or under the nail.  This condition may be caused by bacteria or a fungus. These germs can enter the body through an opening in the skin, such as a cut or a hangnail.  If your condition is mild, it may clear up on its own in a few days. If needed, treatment may include medicine or a procedure to drain pus from an abscess.  To prevent this condition from happening again, wear gloves if doing tasks that require your hands to get wet or to come in contact with chemicals. Also avoid injuring your nails or fingertips. This information is not intended to replace advice given to you by your health care provider. Make sure you discuss any questions you have with your health care provider. Document Revised: 09/24/2017 Document Reviewed: 09/20/2017 Elsevier Patient Education  2020 [employer].

## 2020-03-01 NOTE — Progress Notes (Deleted)
   Subjective:    Patient ID: Manuel Thomas, male    DOB: December 15, 1990, 29 y.o.   MRN: [id number]  HPI 29 y.o. left handed WM presents for poss infection of left thumb x 1 week.  He has soaked in epson salt, cleaned with hydrogen and peroxide. And taking ibuprofen and tylenol. No discharge, is improving some but still very sensitive. No fever, no chills.    There were no vitals taken for this visit.  Medications      Current Outpatient Medications (Hematological):  Marland Kitchen  Cyanocobalamin (VITAMIN B-12 PO), Take 1 tablet by mouth.  Current Outpatient Medications (Other):  Marland Kitchen  Ascorbic Acid (VITAMIN C PO), Take 1 tablet by mouth daily. .  Cholecalciferol (VITAMIN D3) 5000 units CAPS, Takes 1 capsule daily .  naltrexone (DEPADE) 50 MG tablet, Take 1 tablet Daily for Alcohol Cravings .  sertraline (ZOLOFT) 100 MG tablet, Take 1 tablet Daily for Mood .  sulfamethoxazole-trimethoprim (BACTRIM DS) 800-160 MG tablet, Take 1 tablet by mouth 2 (two) times daily. Marland Kitchen  zinc gluconate 50 MG tablet, Take 50 mg by mouth daily.  Problem list He has Asthma; Vitamin D deficiency; Labile hypertension; Mixed hyperlipidemia; Alcohol use disorder, severe, dependence (HCC); Cannabis use disorder, severe, dependence (HCC); Anxiety neurosis; Substance induced mood disorder (HCC); Family history of alcoholism in paternal grandfather; and Family history of alcoholism in father on their problem list.   Review of Systems See HPI    Objective:   Physical Exam Constitutional:      General: He is not in acute distress.    Appearance: Normal appearance. He is not ill-appearing.  Cardiovascular:     Rate and Rhythm: Normal rate and regular rhythm.  Pulmonary:     Effort: Pulmonary effort is normal.     Breath sounds: Normal breath sounds.  Musculoskeletal:     Cervical back: Normal range of motion and neck supple.     Comments: Left thumb with hypergranulation, swelling, erythema along medial left thumb nail. No  fluctuance. No streaking. Good cap refill and sensation.   Skin:    Capillary Refill: Capillary refill takes less than 2 seconds.        Assessment & Plan:  There are no diagnoses linked to this encounter.PROCEDURE NOTE: Ingrown nail debride and I&D Verbal Consent Obtained. Left thumb wiped with alcohol prep pad, then digital block with 2cc of 1% lidocaine  left medial nail lifted and debrided with serosanguineous discharge, nail left intact.  Ingrown tissue debrided. No active bleeding. Dressing applied. Cleansed and dressed.  FOLLOW UP 1 WEEK TO SEE IF NAIL NEEDS TO BE REMOVED

## 2020-03-04 ENCOUNTER — Ambulatory Visit: Payer: No Typology Code available for payment source | Admitting: Physician Assistant

## 2020-04-04 ENCOUNTER — Other Ambulatory Visit: Payer: Self-pay | Admitting: Internal Medicine

## 2020-04-04 DIAGNOSIS — F411 Generalized anxiety disorder: Secondary | ICD-10-CM

## 2020-04-04 DIAGNOSIS — R5383 Other fatigue: Secondary | ICD-10-CM

## 2020-04-06 ENCOUNTER — Other Ambulatory Visit: Payer: Self-pay | Admitting: Internal Medicine

## 2020-04-06 DIAGNOSIS — F1020 Alcohol dependence, uncomplicated: Secondary | ICD-10-CM

## 2020-06-25 ENCOUNTER — Other Ambulatory Visit: Payer: Self-pay | Admitting: Internal Medicine

## 2020-06-25 DIAGNOSIS — F411 Generalized anxiety disorder: Secondary | ICD-10-CM

## 2020-06-25 DIAGNOSIS — R5383 Other fatigue: Secondary | ICD-10-CM

## 2020-07-06 ENCOUNTER — Other Ambulatory Visit: Payer: Self-pay | Admitting: Internal Medicine

## 2020-07-06 DIAGNOSIS — F1020 Alcohol dependence, uncomplicated: Secondary | ICD-10-CM

## 2020-07-09 ENCOUNTER — Other Ambulatory Visit: Payer: Self-pay | Admitting: Internal Medicine

## 2020-07-09 DIAGNOSIS — F1020 Alcohol dependence, uncomplicated: Secondary | ICD-10-CM

## 2020-07-09 MED ORDER — NALTREXONE HCL 50 MG PO TABS: ORAL_TABLET | ORAL | 0 refills | Status: DC

## 2020-09-18 ENCOUNTER — Other Ambulatory Visit: Payer: Self-pay | Admitting: Internal Medicine

## 2020-09-18 DIAGNOSIS — F411 Generalized anxiety disorder: Secondary | ICD-10-CM

## 2020-09-18 DIAGNOSIS — R5383 Other fatigue: Secondary | ICD-10-CM

## 2020-10-11 ENCOUNTER — Other Ambulatory Visit: Payer: Self-pay | Admitting: Internal Medicine

## 2020-10-11 DIAGNOSIS — F1020 Alcohol dependence, uncomplicated: Secondary | ICD-10-CM

## 2020-10-11 MED ORDER — NALTREXONE HCL 50 MG PO TABS: ORAL_TABLET | ORAL | 0 refills | Status: DC

## 2020-12-02 ENCOUNTER — Encounter: Payer: No Typology Code available for payment source | Admitting: Internal Medicine

## 2020-12-19 ENCOUNTER — Other Ambulatory Visit: Payer: Self-pay | Admitting: Internal Medicine

## 2020-12-19 MED ORDER — DOXYCYCLINE HYCLATE 100 MG PO CAPS: ORAL_CAPSULE | ORAL | 0 refills | Status: DC

## 2021-01-12 NOTE — Progress Notes (Signed)
Annual  Screening/Preventative Visit  & Comprehensive Evaluation & Examination   Future Appointments  Date Time Provider Department Center  01/13/2021  2:00 PM Lucky Cowboy, MD [facility] None  01/15/2022 10:00 AM Lucky Cowboy, MD [facility] None                                               This very nice 30 y.o. single WM  presents for a Screening /Preventative Visit & comprehensive evaluation and management of multiple medical co-morbidities.  Patient has been followed for elevated BP's, HLD,   Prediabetes and Vitamin D Deficiency.  Patient also has hx/o polysubstance abuse with Alcohol &Opioids with completion of a rehab program in 2017 and attends AA and has remained drug & alcohol free since Nov 2017.       Mild labile HTN predates since 2019. Patient's BP has been controlled and today's BP is at goal - 120/80. Patient denies any cardiac symptoms as chest pain, palpitations, shortness of breath, dizziness or ankle swelling.      Patient's hyperlipidemia is controlled with diet and medications. Patient denies myalgias or other medication SE's. Last lipids were not at goal:  Lab Results  Component Value Date   CHOL 202 (H) 01/08/2020   HDL 33 (L) 01/08/2020   LDLCALC 138 (H) 01/08/2020   TRIG 168 (H) 01/08/2020   CHOLHDL 6.1 (H) 01/08/2020        Patient has hx/o prediabetes (A1c 5.7% /2013) and patient denies reactive hypoglycemic symptoms, visual blurring, diabetic polys or paresthesias. Last A1c was normal & at goal:    Lab Results  Component Value Date   HGBA1C 5.3 01/08/2020         Finally, patient has history of Vitamin D Deficiency ("23" /2012)  and last vitamin D was still low (goal 70-100):   Lab Results  Component Value Date   VD25OH 39 01/08/2020    Current Outpatient Medications on File Prior to Visit  Medication Sig  . VITAMIN C  Take 1 tablet daily.  Marland Kitchen VITAMIN D 5000 units  Takes 1 capsule daily  . VITAMIN B-12  Take 1 tablet daily  .  Naltrexone/DEPADE 50 MG  Take 1 tablet Daily  . sertraline  100 MG tablet TAKE 1 TABLET  DAILY   . zinc 50 MG tablet Take  daily.     Allergies  Allergen Reactions  . Dilaudid [Hydromorphone Hcl]     Past Medical History:  Diagnosis Date  . Acute alcoholic hepatitis 07/29/2016  . Asthma   . Prediabetes   . Vitamin D deficiency     Health Maintenance  Topic Date Due  . COVID-19 Vaccine (3 - Booster for Pfizer series) 07/02/2020  . INFLUENZA VACCINE  04/21/2021  . TETANUS/TDAP  08/21/2022  . Hepatitis C Screening  Completed  . HIV Screening  Completed  . HPV VACCINES  Aged Out    Immunization History  Administered Date(s) Administered  . Influenza,inj,Quad PF,6+ Mos 08/09/2018  . Influenza,inj,quad, With Preservative 08/06/2016  . Influenza-Unspecified 08/12/2020  . PFIZER  SARS-COV-2 Vacc 12/07/2019, 01/01/2020  . PPD Test 02/19/2014, 04/29/2015, 10/18/2017, 11/02/2018, 01/08/2020  . Td 08/21/2012  . Td (Adult) 09/20/2012     Past Surgical History:  Procedure Laterality Date  . ADENOIDECTOMY    . CLOSED REDUCTION FOREARM FRACTURE Left 2005  . INGUINAL HERNIA REPAIR  2003  .  TONSILLECTOMY      Family History  Problem Relation Age of Onset  . Heart disease Mother   . Heart attack Other   . Arthritis Other   . Hypertension Other   . Diabetes Other   . Stroke Other   . Alcohol abuse Paternal Grandfather     Social History   Socioeconomic History  . Marital status: Single    Spouse name: None  . Number of children: None  Occupational History  . Not on file  Tobacco Use  . Smoking status: Former Smoker    Types: Cigarettes    Quit date: 07/26/2016    Years since quitting: 4.4  . Smokeless tobacco: Never Used  Substance and Sexual Activity  . Alcohol use: Past 2017  . Drug use: Past 2017  . Sexual activity: Active monogamous heterosexual  relationship    ROS Constitutional: Denies fever, chills, weight loss/gain, headaches, insomnia,  night sweats  or change in appetite. Does c/o fatigue. Eyes: Denies redness, blurred vision, diplopia, discharge, itchy or watery eyes.  ENT: Denies discharge, congestion, post nasal drip, epistaxis, sore throat, earache, hearing loss, dental pain, Tinnitus, Vertigo, Sinus pain or snoring.  Cardio: Denies chest pain, palpitations, irregular heartbeat, syncope, dyspnea, diaphoresis, orthopnea, PND, claudication or edema Respiratory: denies cough, dyspnea, DOE, pleurisy, hoarseness, laryngitis or wheezing.  Gastrointestinal: Denies dysphagia, heartburn, reflux, water brash, pain, cramps, nausea, vomiting, bloating, diarrhea, constipation, hematemesis, melena, hematochezia, jaundice or hemorrhoids Genitourinary: Denies dysuria, frequency, urgency, nocturia, hesitancy, discharge, hematuria or flank pain Musculoskeletal: Denies arthralgia, myalgia, stiffness, Jt. Swelling, pain, limp or strain/sprain. Denies Falls. Skin: Denies puritis, rash, hives, warts, acne, eczema or change in skin lesion Neuro: No weakness, tremor, incoordination, spasms, paresthesia or pain Psychiatric: Denies confusion, memory loss or sensory loss. Denies Depression. Endocrine: Denies change in weight, skin, hair change, nocturia, and paresthesia, diabetic polys, visual blurring or hyper / hypo glycemic episodes.  Heme/Lymph: No excessive bleeding, bruising or enlarged lymph nodes.  Physical Exam  BP 120/80   Pulse 69   Temp (!) 97 F (36.1 C)   Resp 16   Ht 5' 10.5" (1.791 m)   Wt 157 lb 6.4 oz (71.4 kg)   SpO2 96%   BMI 22.27 kg/m   General Appearance: Well nourished and well groomed and in no apparent distress.  Eyes: PERRLA, EOMs, conjunctiva no swelling or erythema, normal fundi and vessels. Sinuses: No frontal/maxillary tenderness ENT/Mouth: EACs patent / TMs  nl. Nares clear without erythema, swelling, mucoid exudates. Oral hygiene is good. No erythema, swelling, or exudate. Tongue normal, non-obstructing. Tonsils not  swollen or erythematous. Hearing normal.  Neck: Supple, thyroid not palpable. No bruits, nodes or JVD. Respiratory: Respiratory effort normal.  BS equal and clear bilateral without rales, rhonci, wheezing or stridor. Cardio: Heart sounds are normal with regular rate and rhythm and no murmurs, rubs or gallops. Peripheral pulses are normal and equal bilaterally without edema. No aortic or femoral bruits. Chest: symmetric with normal excursions and percussion.  Abdomen: Soft, with Nl bowel sounds. Nontender, no guarding, rebound, hernias, masses, or organomegaly.  Lymphatics: Non tender without lymphadenopathy.  Musculoskeletal: Full ROM all peripheral extremities, joint stability, 5/5 strength, and normal gait. Skin: Warm and dry without rashes, lesions, cyanosis, clubbing or  ecchymosis.  Neuro: Cranial nerves intact, reflexes equal bilaterally. Normal muscle tone, no cerebellar symptoms. Sensation intact.  Pysch: Alert and oriented X 3 with normal affect, insight and judgment appropriate.   Assessment and Plan  1. Annual Preventative/Screening Exam  2. Labile hypertension  - EKG 12-Lead - Urinalysis, Routine w reflex microscopic - Microalbumin / creatinine urine ratio - CBC with Differential/Platelet - COMPLETE METABOLIC PANEL WITH GFR - Magnesium - TSH  3. Hyperlipidemia, mixed  - EKG 12-Lead - Lipid panel - TSH  4. Abnormal glucose  - EKG 12-Lead - Hemoglobin A1c - Insulin, random  5. Vitamin D deficiency  - VITAMIN D 25 Hydroxy   6. Prediabetes   7. Screening-pulmonary TB  - TB Skin Test  8. Screening for colorectal cancer  - POC Hemoccult Bld/Stl   9. Screening for ischemic heart disease  - EKG 12-Lead  10. Screening for heart disease  - EKG 12-Lead  11. FHx: heart disease  - EKG 12-Lead  12. Fatigue  - Iron,Total/Total Iron Binding Cap - Vitamin B12 - Testosterone - CBC with Differential/Platelet - TSH  13. Medication management  -  Urinalysis, Routine w reflex microscopic - Microalbumin / creatinine urine ratio - CBC with Differential/Platelet - COMPLETE METABOLIC PANEL WITH GFR - Magnesium - Lipid panel - TSH - Hemoglobin A1c - Insulin, random - VITAMIN D 25 Hydroxy          Patient was counseled in prudent diet, weight control to achieve/maintain BMI less than 25, BP monitoring, regular exercise and medications as discussed.  Discussed med effects and SE's. Routine screening labs and tests as requested with regular follow-up as recommended. Over 40 minutes of exam, counseling, chart review and high complex critical decision making was performed   Marinus Maw, MD

## 2021-01-12 NOTE — Patient Instructions (Signed)
Due to recent changes in healthcare laws, you may see the results of your imaging and laboratory studies on MyChart before your provider has had a chance to review them.  We understand that in some cases there may be results that are confusing or concerning to you. Not all laboratory results come back in the same time frame and the provider may be waiting for multiple results in order to interpret others.  Please give [place] 48 hours in order for your provider to thoroughly review all the results before contacting the office for clarification of your results.   +++++++++++++++++++++++++++++++++  Vit D  & Vit C 1,000 mg   are recommended to help protect  against the Covid-19 and other Corona viruses.    Also it's recommended  to take  Zinc 50 mg  to help  protect against the Covid-19   and best place to get  is also on [email or web address]  and don't pay more than 6-8 cents /pill !  ================================= Coronavirus (COVID-19) Are you at risk?  Are you at risk for the Coronavirus (COVID-19)?  To be considered HIGH RISK for Coronavirus (COVID-19), you have to meet the following criteria:  . Traveled to [place], [place], [place], [place] or [place]; or in the [place] to [place], [place], [place]  . or [place]; and have fever, cough, and shortness of breath within the last 2 weeks of travel OR . Been in close contact with a person diagnosed with COVID-19 within the last 2 weeks and have  . fever, cough,and shortness of breath .  . IF YOU DO NOT MEET THESE CRITERIA, YOU ARE CONSIDERED LOW RISK FOR COVID-19.  What to do if you are HIGH RISK for COVID-19?  Marland Kitchen If you are having a medical emergency, call 911. . Seek medical care right away. Before you go to a doctor's office, urgent care or emergency department, .  call ahead and tell them about your recent travel, contact with someone diagnosed with COVID-19  .  and your symptoms.  . You should receive instructions from your  physician's office regarding next steps of care.  . When you arrive at healthcare provider, tell the healthcare staff immediately you have returned from  . visiting [place], [place], [place], [place] or [place]; or traveled in the [place] to [place], [place],  . [place] or [place] in the last two weeks or you have been in close contact with a person diagnosed with  . COVID-19 in the last 2 weeks.   . Tell the health care staff about your symptoms: fever, cough and shortness of breath. . After you have been seen by a medical provider, you will be either: o Tested for (COVID-19) and discharged home on quarantine except to seek medical care if  o symptoms worsen, and asked to  - Stay home and avoid contact with others until you get your results (4-5 days)  - Avoid travel on public transportation if possible (such as bus, train, or airplane) or o Sent to the Emergency Department by EMS for evaluation, COVID-19 testing  and  o possible admission depending on your condition and test results.  What to do if you are LOW RISK for COVID-19?  Reduce your risk of any infection by using the same precautions used for avoiding the common cold or flu:  Marland Kitchen Wash your hands often with soap and warm water for at least 20 seconds.  If soap and water are not readily  available,  . use an alcohol-based hand sanitizer with at least 60% alcohol.  . If coughing or sneezing, cover your mouth and nose by coughing or sneezing into the elbow areas of your shirt or coat, .  into a tissue or into your sleeve (not your hands). . Avoid shaking hands with others and consider head nods or verbal greetings only. . Avoid touching your eyes, nose, or mouth with unwashed hands.  . Avoid close contact with people who are sick. . Avoid places or events with large numbers of people in one location, like concerts or sporting events. . Carefully consider travel plans you have or are making. . If you are planning any travel  outside or inside the [place], visit the CDC's Travelers' Health webpage for the latest health notices. . If you have some symptoms but not all symptoms, continue to monitor at home and seek medical attention  . if your symptoms worsen. . If you are having a medical emergency, call 911. >>>>>>>>>>>>>>>>>>>>>>>> Preventive Care for Adults  A healthy lifestyle and preventive care can promote health and wellness. Preventive health guidelines for men include the following key practices:  A routine yearly physical is a good way to check with your health care provider about your health and preventative screening. It is a chance to share any concerns and updates on your health and to receive a thorough exam.  Visit your dentist for a routine exam and preventative care every 6 months. Brush your teeth twice a day and floss once a day. Good oral hygiene prevents tooth decay and gum disease.  The frequency of eye exams is based on your age, health, family medical history, use of contact lenses, and other factors. Follow your health care provider's recommendations for frequency of eye exams.  Eat a healthy diet. Foods such as vegetables, fruits, whole grains, low-fat dairy products, and lean protein foods contain the nutrients you need without too many calories. Decrease your intake of foods high in solid fats, added sugars, and salt. Eat the right amount of calories for you. Get information about a proper diet from your health care provider, if necessary.  Regular physical exercise is one of the most important things you can do for your health. Most adults should get at least 150 minutes of moderate-intensity exercise (any activity that increases your heart rate and causes you to sweat) each week. In addition, most adults need muscle-strengthening exercises on 2 or more days a week.  Maintain a healthy weight. The body mass index (BMI) is a screening tool to identify possible weight problems. It provides an  estimate of body fat based on height and weight. Your health care provider can find your BMI and can help you achieve or maintain a healthy weight. For adults 20 years and older:  A BMI below 18.5 is considered underweight.  A BMI of 18.5 to 24.9 is normal.  A BMI of 25 to 29.9 is considered overweight.  A BMI of 30 and above is considered obese.  Maintain normal blood lipids and cholesterol levels by exercising and minimizing your intake of saturated fat. Eat a balanced diet with plenty of fruit and vegetables. Blood tests for lipids and cholesterol should begin at age 45 and be repeated every 5 years. If your lipid or cholesterol levels are high, you are over 50, or you are at high risk for heart disease, you may need your cholesterol levels checked more frequently. Ongoing high lipid and cholesterol levels should be treated  with medicines if diet and exercise are not working.  If you smoke, find out from your health care provider how to quit. If you do not use tobacco, do not start.  Lung cancer screening is recommended for adults aged 2-80 years who are at high risk for developing lung cancer because of a history of smoking. A yearly low-dose CT scan of the lungs is recommended for people who have at least a 30-pack-year history of smoking and are a current smoker or have quit within the past 15 years. A pack year of smoking is smoking an average of 1 pack of cigarettes a day for 1 year (for example: 1 pack a day for 30 years or 2 packs a day for 15 years). Yearly screening should continue until the smoker has stopped smoking for at least 15 years. Yearly screening should be stopped for people who develop a health problem that would prevent them from having lung cancer treatment.  If you choose to drink alcohol, do not have more than 2 drinks per day. One drink is considered to be 12 ounces (355 mL) of beer, 5 ounces (148 mL) of wine, or 1.5 ounces (44 mL) of liquor.  High blood pressure  causes heart disease and increases the risk of stroke. Your blood pressure should be checked. Ongoing high blood pressure should be treated with medicines, if weight loss and exercise are not effective.  If you are 35-59 years old, ask your health care provider if you should take aspirin to prevent heart disease.  Diabetes screening involves taking a blood sample to check your fasting blood sugar level. Testing should be considered at a younger age or be carried out more frequently if you are overweight and have at least 1 risk factor for diabetes.  Colorectal cancer can be detected and often prevented. Most routine colorectal cancer screening begins at the age of 50 and continues through age 67. However, your health care provider may recommend screening at an earlier age if you have risk factors for colon cancer. On a yearly basis, your health care provider may provide home test kits to check for hidden blood in the stool. Use of a small camera at the end of a tube to directly examine the colon (sigmoidoscopy or colonoscopy) can detect the earliest forms of colorectal cancer. Talk to your health care provider about this at age 75, when routine screening begins. Direct exam of the colon should be repeated every 5-10 years through age 50, unless early forms of precancerous polyps or small growths are found.  Screening for abdominal aortic aneurysm (AAA)  are recommended for persons over age 31 who have history of hypertensionor who are current or former smokers.  Talk with your health care provider about prostate cancer screening.  Testicular cancer screening is recommended for adult males. Screening includes self-exam, a health care provider exam, and other screening tests. Consult with your health care provider about any symptoms you have or any concerns you have about testicular cancer.  Use sunscreen. Apply sunscreen liberally and repeatedly throughout the day. You should seek shade when your shadow  is shorter than you. Protect yourself by wearing long sleeves, pants, a wide-brimmed hat, and sunglasses year round, whenever you are outdoors.  Once a month, do a whole-body skin exam, using a mirror to look at the skin on your back. Tell your health care provider about new moles, moles that have irregular borders, moles that are larger than a pencil eraser, or moles that  have changed in shape or color.  Stay current with required vaccines (immunizations).  Influenza vaccine. All adults should be immunized every year.  Tetanus, diphtheria, and acellular pertussis (Td, Tdap) vaccine. An adult who has not previously received Tdap or who does not know his vaccine status should receive 1 dose of Tdap. This initial dose should be followed by tetanus and diphtheria toxoids (Td) booster doses every 10 years. Adults with an unknown or incomplete history of completing a 3-dose immunization series with Td-containing vaccines should begin or complete a primary immunization series including a Tdap dose. Adults should receive a Td booster every 10 years.  Zoster vaccine. One dose is recommended for adults aged 60 years or older unless certain conditions are present.    Pneumococcal 13-valent conjugate (PCV13) vaccine. When indicated, a person who is uncertain of his immunization history and has no record of immunization should receive the PCV13 vaccine. An adult aged 19 years or older who has certain medical conditions and has not been previously immunized should receive 1 dose of PCV13 vaccine. This PCV13 should be followed with a dose of pneumococcal polysaccharide (PPSV23) vaccine. The PPSV23 vaccine dose should be obtained at least 8 weeks after the dose of PCV13 vaccine. An adult aged 19 years or older who has certain medical conditions and previously received 1 or more doses of PPSV23 vaccine should receive 1 dose of PCV13. The PCV13 vaccine dose should be obtained 1 or more years after the last PPSV23  vaccine dose.    Pneumococcal polysaccharide (PPSV23) vaccine. When PCV13 is also indicated, PCV13 should be obtained first. All adults aged 65 years and older should be immunized. An adult younger than age 65 years who has certain medical conditions should be immunized. Any person who resides in a nursing home or long-term care facility should be immunized. An adult smoker should be immunized. People with an immunocompromised condition and certain other conditions should receive both PCV13 and PPSV23 vaccines. People with human immunodeficiency virus (HIV) infection should be immunized as soon as possible after diagnosis. Immunization during chemotherapy or radiation therapy should be avoided. Routine use of PPSV23 vaccine is not recommended for American Indians, [place] Natives, or people younger than 65 years unless there are medical conditions that require PPSV23 vaccine. When indicated, people who have unknown immunization and have no record of immunization should receive PPSV23 vaccine. One-time revaccination 5 years after the first dose of PPSV23 is recommended for people aged 19-64 years who have chronic kidney failure, nephrotic syndrome, asplenia, or immunocompromised conditions. People who received 1-2 doses of PPSV23 before age 65 years should receive another dose of PPSV23 vaccine at age 65 years or later if at least 5 years have passed since the previous dose. Doses of PPSV23 are not needed for people immunized with PPSV23 at or after age 65 years.  Hepatitis A vaccine. Adults who wish to be protected from this disease, have certain high-risk conditions, work with hepatitis A-infected animals, work in hepatitis A research labs, or travel to or work in countries with a high rate of hepatitis A should be immunized. Adults who were previously unvaccinated and who anticipate close contact with an international adoptee during the first 60 days after arrival in the [place] from a country with a  high rate of hepatitis A should be immunized.  Hepatitis B vaccine. Adults should be immunized if they wish to be protected from this disease, have certain high-risk conditions, may be exposed to blood or other infectious   body fluids, are household contacts or sex partners of hepatitis B positive people, are clients or workers in certain care facilities, or travel to or work in countries with a high rate of hepatitis B.  Preventive Service / Frequency  Ages 16 to 58  Blood pressure check.  Lipid and cholesterol check.  Hepatitis C blood test.** / For any individual with known risks for hepatitis C.  Skin self-exam. / Monthly.  Influenza vaccine. / Every year.  Tetanus, diphtheria, and acellular pertussis (Tdap, Td) vaccine.** / Consult your health care provider. 1 dose of Td every 10 years.  HPV vaccine. / 3 doses over 6 months, if 36 or younger.  Measles, mumps, rubella (MMR) vaccine.** / You need at least 1 dose of MMR if you were born in 1957 or later. You may also need a second dose.  Pneumococcal 13-valent conjugate (PCV13) vaccine.** / Consult your health care provider.  Pneumococcal polysaccharide (PPSV23) vaccine.** / 1 to 2 doses if you smoke cigarettes or if you have certain conditions.  Meningococcal vaccine.** / 1 dose if you are age 33 to 40 years and a Market researcher living in a residence hall, or have one of several medical conditions. You may also need additional booster doses.  Hepatitis A vaccine.** / Consult your health care provider.  Hepatitis B vaccine.** / Consult your health care provider. +++++++++ Recommend Adult Low Dose Aspirin or  coated  Aspirin 81 mg daily  To reduce risk of Colon Cancer 40 %,  Skin Cancer 26 % ,  Melanoma 46%  and  Pancreatic cancer 60% ++++++++++++++++++ Vitamin D goal  is between 70-100.  Please make sure that you are taking your Vitamin D as directed.  It is very important as a natural anti-inflammatory   helping hair, skin, and nails, as well as reducing stroke and heart attack risk.  It helps your bones and helps with mood. It also decreases numerous cancer risks so please take it as directed.  Low Vit D is associated with a 200-300% higher risk for CANCER  and 200-300% higher risk for HEART   ATTACK  &  STROKE.   .....................................Marland Kitchen It is also associated with higher death rate at younger ages,  autoimmune diseases like Rheumatoid arthritis, Lupus, Multiple Sclerosis.    Also many other serious conditions, like depression, Alzheimer's Dementia, infertility, muscle aches, fatigue, fibromyalgia - just to name a few. +++++++++++++++++++++ Recommend the book "The END of DIETING" by Dr Excell Seltzer  & the book "The END of DIABETES " by Dr Excell Seltzer At [email or web address] - get book & Audio CD's    Being diabetic has a  300% increased risk for heart attack, stroke, cancer, and alzheimer- type vascular dementia. It is very important that you work harder with diet by avoiding all foods that are white. Avoid white rice (brown & wild rice is OK), white potatoes (sweetpotatoes in moderation is OK), White bread or wheat bread or anything made out of white flour like bagels, donuts, rolls, buns, biscuits, cakes, pastries, cookies, pizza crust, and pasta (made from white flour & egg whites) - vegetarian pasta or spinach or wheat pasta is OK. Multigrain breads like Arnold's or [employer], or multigrain sandwich thins or flatbreads.  Diet, exercise and weight loss can reverse and cure diabetes in the early stages.  Diet, exercise and weight loss is very important in the control and prevention of complications of diabetes which affects every system in your body, ie. Brain - dementia/stroke, eyes -  glaucoma/blindness, heart - heart attack/heart failure, kidneys - dialysis, stomach - gastric paralysis, intestines - malabsorption, nerves - severe painful neuritis, circulation - gangrene & loss of a  leg(s), and finally cancer and Alzheimers.    I recommend avoid fried & greasy foods,  sweets/candy, white rice (brown or wild rice or Quinoa is OK), white potatoes (sweet potatoes are OK) - anything made from white flour - bagels, doughnuts, rolls, buns, biscuits,white and wheat breads, pizza crust and traditional pasta made of white flour & egg white(vegetarian pasta or spinach or wheat pasta is OK).  Multi-grain bread is OK - like multi-grain flat bread or sandwich thins. Avoid alcohol in excess. Exercise is also important.    Eat all the vegetables you want - avoid meat, especially red meat and dairy - especially cheese.  Cheese is the most concentrated form of trans-fats which is the worst thing to clog up our arteries. Veggie cheese is OK which can be found in the fresh produce section at [employer] or [employer] or [employer]  +++++++++++++++++++ DASH Eating Plan  DASH stands for "Dietary Approaches to Stop Hypertension."   The DASH eating plan is a healthy eating plan that has been shown to reduce high blood pressure (hypertension). Additional health benefits may include reducing the risk of type 2 diabetes mellitus, heart disease, and stroke. The DASH eating plan may also help with weight loss. WHAT DO I NEED TO KNOW ABOUT THE DASH EATING PLAN? For the DASH eating plan, you will follow these general guidelines:  Choose foods with a percent daily value for sodium of less than 5% (as listed on the food label).  Use salt-free seasonings or herbs instead of table salt or sea salt.  Check with your health care provider or pharmacist before using salt substitutes.  Eat lower-sodium products, often labeled as "lower sodium" or "no salt added."  Eat fresh foods.  Eat more vegetables, fruits, and low-fat dairy products.  Choose whole grains. Look for the word "whole" as the first word in the ingredient list.  Choose fish   Limit sweets, desserts, sugars, and sugary drinks.  Choose  heart-healthy fats.  Eat veggie cheese   Eat more home-cooked food and less restaurant, buffet, and fast food.  Limit fried foods.  Cook foods using methods other than frying.  Limit canned vegetables. If you do use them, rinse them well to decrease the sodium.  When eating at a restaurant, ask that your food be prepared with less salt, or no salt if possible.                      WHAT FOODS CAN I EAT? Read Dr Fara Olden Fuhrman's books on The End of Dieting & The End of Diabetes  Grains Whole grain or whole wheat bread. Brown rice. Whole grain or whole wheat pasta. Quinoa, bulgur, and whole grain cereals. Low-sodium cereals. Corn or whole wheat flour tortillas. Whole grain cornbread. Whole grain crackers. Low-sodium crackers.  Vegetables Fresh or frozen vegetables (raw, steamed, roasted, or grilled). Low-sodium or reduced-sodium tomato and vegetable juices. Low-sodium or reduced-sodium tomato sauce and paste. Low-sodium or reduced-sodium canned vegetables.   Fruits All fresh, canned (in natural juice), or frozen fruits.  Protein Products  All fish and seafood.  Dried beans, peas, or lentils. Unsalted nuts and seeds. Unsalted canned beans.  Dairy Low-fat dairy products, such as skim or 1% milk, 2% or reduced-fat cheeses, low-fat ricotta or cottage cheese, or plain low-fat yogurt. Low-sodium  or reduced-sodium cheeses.  Fats and Oils Tub margarines without trans fats. Light or reduced-fat mayonnaise and salad dressings (reduced sodium). Avocado. Safflower, olive, or canola oils. Natural peanut or almond butter.  Other Unsalted popcorn and pretzels. The items listed above may not be a complete list of recommended foods or beverages. Contact your dietitian for more options.  +++++++++++++++++++  WHAT FOODS ARE NOT RECOMMENDED? Grains/ White flour or wheat flour White bread. White pasta. White rice. Refined cornbread. Bagels and croissants. Crackers that contain trans  fat.  Vegetables  Creamed or fried vegetables. Vegetables in a . Regular canned vegetables. Regular canned tomato sauce and paste. Regular tomato and vegetable juices.  Fruits Dried fruits. Canned fruit in light or heavy syrup. Fruit juice.  Meat and Other Protein Products Meat in general - RED meat & White meat.  Fatty cuts of meat. Ribs, chicken wings, all processed meats as bacon, sausage, bologna, salami, fatback, hot dogs, bratwurst and packaged luncheon meats.  Dairy Whole or 2% milk, cream, half-and-half, and cream cheese. Whole-fat or sweetened yogurt. Full-fat cheeses or blue cheese. Non-dairy creamers and whipped toppings. Processed cheese, cheese spreads, or cheese curds.  Condiments Onion and garlic salt, seasoned salt, table salt, and sea salt. Canned and packaged gravies. Worcestershire sauce. Tartar sauce. Barbecue sauce. Teriyaki sauce. Soy sauce, including reduced sodium. Steak sauce. Fish sauce. Oyster sauce. Cocktail sauce. Horseradish. Ketchup and mustard. Meat flavorings and tenderizers. Bouillon cubes. Hot sauce. Tabasco sauce. Marinades. Taco seasonings. Relishes.  Fats and Oils Butter, stick margarine, lard, shortening and bacon fat. Coconut, palm kernel, or palm oils. Regular salad dressings.  Pickles and olives. Salted popcorn and pretzels.  The items listed above may not be a complete list of foods and beverages to avoid.

## 2021-01-13 ENCOUNTER — Other Ambulatory Visit: Payer: Self-pay

## 2021-01-13 ENCOUNTER — Encounter: Payer: Self-pay | Admitting: Internal Medicine

## 2021-01-13 ENCOUNTER — Ambulatory Visit (INDEPENDENT_AMBULATORY_CARE_PROVIDER_SITE_OTHER): Payer: No Typology Code available for payment source | Admitting: Internal Medicine

## 2021-01-13 VITALS — BP 120/80 | HR 69 | Temp 97.0°F | Resp 16 | Ht 70.5 in | Wt 157.4 lb

## 2021-01-13 DIAGNOSIS — R0989 Other specified symptoms and signs involving the circulatory and respiratory systems: Secondary | ICD-10-CM | POA: Diagnosis not present

## 2021-01-13 DIAGNOSIS — R7303 Prediabetes: Secondary | ICD-10-CM

## 2021-01-13 DIAGNOSIS — E782 Mixed hyperlipidemia: Secondary | ICD-10-CM

## 2021-01-13 DIAGNOSIS — Z111 Encounter for screening for respiratory tuberculosis: Secondary | ICD-10-CM | POA: Diagnosis not present

## 2021-01-13 DIAGNOSIS — Z136 Encounter for screening for cardiovascular disorders: Secondary | ICD-10-CM | POA: Diagnosis not present

## 2021-01-13 DIAGNOSIS — Z8249 Family history of ischemic heart disease and other diseases of the circulatory system: Secondary | ICD-10-CM | POA: Diagnosis not present

## 2021-01-13 DIAGNOSIS — Z79899 Other long term (current) drug therapy: Secondary | ICD-10-CM

## 2021-01-13 DIAGNOSIS — Z1211 Encounter for screening for malignant neoplasm of colon: Secondary | ICD-10-CM

## 2021-01-13 DIAGNOSIS — E559 Vitamin D deficiency, unspecified: Secondary | ICD-10-CM

## 2021-01-13 DIAGNOSIS — Z0000 Encounter for general adult medical examination without abnormal findings: Secondary | ICD-10-CM | POA: Diagnosis not present

## 2021-01-13 DIAGNOSIS — Z0001 Encounter for general adult medical examination with abnormal findings: Secondary | ICD-10-CM

## 2021-01-13 DIAGNOSIS — R7309 Other abnormal glucose: Secondary | ICD-10-CM

## 2021-01-13 DIAGNOSIS — R5383 Other fatigue: Secondary | ICD-10-CM

## 2021-01-14 LAB — URINALYSIS, ROUTINE W REFLEX MICROSCOPIC
Bilirubin Urine: NEGATIVE
Glucose, UA: NEGATIVE
Hgb urine dipstick: NEGATIVE
Ketones, ur: NEGATIVE
Leukocytes,Ua: NEGATIVE
Nitrite: NEGATIVE
Protein, ur: NEGATIVE
Specific Gravity, Urine: 1.018 (ref 1.001–1.035)
pH: 7.5 (ref 5.0–8.0)

## 2021-01-14 LAB — MAGNESIUM: Magnesium: 2.2 mg/dL (ref 1.5–2.5)

## 2021-01-14 LAB — CBC WITH DIFFERENTIAL/PLATELET
Absolute Monocytes: 450 cells/uL (ref 200–950)
Basophils Absolute: 60 cells/uL (ref 0–200)
Basophils Relative: 1 %
Eosinophils Absolute: 138 cells/uL (ref 15–500)
Eosinophils Relative: 2.3 %
HCT: 45.3 % (ref 38.5–50.0)
Hemoglobin: 15.2 g/dL (ref 13.2–17.1)
Lymphs Abs: 1794 cells/uL (ref 850–3900)
MCH: 29.2 pg (ref 27.0–33.0)
MCHC: 33.6 g/dL (ref 32.0–36.0)
MCV: 86.9 fL (ref 80.0–100.0)
MPV: 12 fL (ref 7.5–12.5)
Monocytes Relative: 7.5 %
Neutro Abs: 3558 cells/uL (ref 1500–7800)
Neutrophils Relative %: 59.3 %
Platelets: 236 10*3/uL (ref 140–400)
RBC: 5.21 10*6/uL (ref 4.20–5.80)
RDW: 12.8 % (ref 11.0–15.0)
Total Lymphocyte: 29.9 %
WBC: 6 10*3/uL (ref 3.8–10.8)

## 2021-01-14 LAB — INSULIN, RANDOM: Insulin: 3.7 u[IU]/mL

## 2021-01-14 LAB — HEMOGLOBIN A1C
Hgb A1c MFr Bld: 5.3 % of total Hgb (ref <5.7)
Mean Plasma Glucose: 105 mg/dL
eAG (mmol/L): 5.8 mmol/L

## 2021-01-14 LAB — VITAMIN D 25 HYDROXY (VIT D DEFICIENCY, FRACTURES): Vit D, 25-Hydroxy: 95 ng/mL (ref 30–100)

## 2021-01-14 LAB — LIPID PANEL
Cholesterol: 187 mg/dL (ref <200)
HDL: 40 mg/dL (ref >=40)
LDL Cholesterol (Calc): 123 mg/dL (calc) — ABNORMAL HIGH
Non-HDL Cholesterol (Calc): 147 mg/dL (calc) — ABNORMAL HIGH (ref <130)
Total CHOL/HDL Ratio: 4.7 (calc) (ref <5.0)
Triglycerides: 127 mg/dL (ref <150)

## 2021-01-14 LAB — COMPLETE METABOLIC PANEL WITH GFR
AG Ratio: 1.8 (calc) (ref 1.0–2.5)
ALT: 12 U/L (ref 9–46)
AST: 14 U/L (ref 10–40)
Albumin: 4.6 g/dL (ref 3.6–5.1)
Alkaline phosphatase (APISO): 84 U/L (ref 36–130)
BUN: 7 mg/dL (ref 7–25)
CO2: 29 mmol/L (ref 20–32)
Calcium: 10 mg/dL (ref 8.6–10.3)
Chloride: 103 mmol/L (ref 98–110)
Creat: 0.92 mg/dL (ref 0.60–1.35)
GFR, Est African American: 130 mL/min/{1.73_m2} (ref >=60)
GFR, Est Non African American: 112 mL/min/{1.73_m2} (ref >=60)
Globulin: 2.5 g/dL (calc) (ref 1.9–3.7)
Glucose, Bld: 85 mg/dL (ref 65–99)
Potassium: 4.7 mmol/L (ref 3.5–5.3)
Sodium: 139 mmol/L (ref 135–146)
Total Bilirubin: 0.6 mg/dL (ref 0.2–1.2)
Total Protein: 7.1 g/dL (ref 6.1–8.1)

## 2021-01-14 LAB — TESTOSTERONE: Testosterone: 435 ng/dL (ref 250–827)

## 2021-01-14 LAB — MICROALBUMIN / CREATININE URINE RATIO
Creatinine, Urine: 160 mg/dL (ref 20–320)
Microalb Creat Ratio: 5 mcg/mg creat (ref <30)
Microalb, Ur: 0.8 mg/dL

## 2021-01-14 LAB — TSH: TSH: 1.32 mIU/L (ref 0.40–4.50)

## 2021-01-14 LAB — IRON, TOTAL/TOTAL IRON BINDING CAP
%SAT: 39 % (calc) (ref 20–48)
Iron: 131 ug/dL (ref 50–195)
TIBC: 340 mcg/dL (calc) (ref 250–425)

## 2021-01-14 LAB — VITAMIN B12: Vitamin B-12: 1072 pg/mL (ref 200–1100)

## 2021-01-14 NOTE — Progress Notes (Signed)
============================================================  -   Test results slightly outside the reference range are not unusual.  If there is anything important, I will review this with you,  otherwise it is considered normal test values.  If you have further questions,  please do not hesitate to contact me at the office or via My Chart.  ============================================================  ============================================================   - Iron levels - Normal & OK   - Vitamin B12 levels very high now, So . . . . . .   Can cut your Vitamin B12 dose down to 3 x /week  ============================================================  ============================================================   - Testosterone levels Normal  ============================================================  ============================================================   - Total Chol = 187 - Much much Better ! , But   - bad LDL Chol = 123 is still too high,                            So important to get on a stricter low Chol diet    - Cholesterol only comes from animal sources  - ie. meat, dairy, egg yolks   - Eat all the vegetables you want.   - Avoid meat, especially red meat - Beef AND Pork .   - Avoid cheese & dairy - milk & ice cream.    - Cheese is the most concentrated form of trans-fats which  is the worst thing to clog up our arteries.   - Veggie cheese is OK which can be found in the fresh  produce section at [employer] or [employer] or [employer]  ============================================================  ============================================================   - A1c - Normal - Great - No Diabetes !  ============================================================  ============================================================   - Vitamin D = 95 - Excellent  ============================================================   ============================================================  All Else - CBC - Kidneys - Electrolytes - Liver - Magnesium & Thyroid    - all Normal / OK  ============================================================  ============================================================   - Keep up the [place] Work !   ============================================================  ============================================================

## 2021-07-11 ENCOUNTER — Other Ambulatory Visit: Payer: Self-pay | Admitting: Internal Medicine

## 2021-07-11 DIAGNOSIS — F1020 Alcohol dependence, uncomplicated: Secondary | ICD-10-CM

## 2021-07-16 NOTE — Progress Notes (Deleted)
FOLLOW UP  Assessment and Plan:   Hypertension Well controlled with current medications  Monitor blood pressure at home; patient to call if consistently greater than 130/80 Continue DASH diet.   Reminder to go to the ER if any CP, SOB, nausea, dizziness, severe HA, changes vision/speech, left arm numbness and tingling and jaw pain.  Cholesterol Currently not at goal;  Continue low cholesterol diet and exercise.  Check lipid panel.   Abnormal glucose Continue medication: Continue diet and exercise.  Perform daily foot/skin check, notify office of any concerning changes.  Check A1C  Obesity with co morbidities Long discussion about weight loss, diet, and exercise Recommended diet heavy in fruits and veggies and low in animal meats, cheeses, and dairy products, appropriate calorie intake Discussed ideal weight for height (below ***) and initial weight goal (***) Patient will work on *** Will follow up in 3 months  Vitamin D Def At goal at last visit; continue supplementation to maintain goal of 60-100 Defer Vit D level  Continue diet and meds as discussed. Further disposition pending results of labs. Discussed med's effects and SE's.   Over 30 minutes of exam, counseling, chart review, and critical decision making was performed.   Future Appointments  Date Time Provider Department Center  07/21/2021  2:30 PM Revonda Humphrey, NP [facility] None  01/15/2022 10:00 AM Lucky Cowboy, MD [facility] None    ----------------------------------------------------------------------------------------------------------------------  HPI 30 y.o. male  presents for 3 month follow up on hypertension, cholesterol, diabetes, weight and vitamin D deficiency.   BMI is There is no height or weight on file to calculate BMI., he {HAS HAS ENI:77824} been working on diet and exercise. Wt Readings from Last 3 Encounters:  01/13/21 157 lb 6.4 oz (71.4 kg)  02/26/20 169 lb (76.7 kg)  01/08/20 170  lb 9.6 oz (77.4 kg)    His blood pressure {HAS HAS NOT:18834} been controlled at home, today their BP is    He {DOES_DOES MPN:36144} workout. He denies chest pain, shortness of breath, dizziness.   He {ACTION; IS/IS RXV:40086761} on cholesterol medication {Cholesterol meds:21887} and denies myalgias. His cholesterol {ACTION; IS/IS NOT:21021397} at goal. The cholesterol last visit was:   Lab Results  Component Value Date   CHOL 187 01/13/2021   HDL 40 01/13/2021   LDLCALC 123 (H) 01/13/2021   TRIG 127 01/13/2021   CHOLHDL 4.7 01/13/2021    He {Has/has not:18111} been working on diet and exercise for prediabetes, and denies {Symptoms; diabetes w/o none:19199}. Last A1C in the office was:  Lab Results  Component Value Date   HGBA1C 5.3 01/13/2021   Patient is on Vitamin D supplement.   Lab Results  Component Value Date   VD25OH 95 01/13/2021        Current Medications:  Current Outpatient Medications on File Prior to Visit  Medication Sig   Ascorbic Acid (VITAMIN C PO) Take 1 tablet by mouth daily.   aspirin EC 81 MG tablet Take 81 mg by mouth daily. Swallow whole.   Cholecalciferol (VITAMIN D3) 5000 units CAPS Takes 1 capsule daily   Cyanocobalamin (VITAMIN B-12 PO) Take 1 tablet by mouth.   naltrexone (DEPADE) 50 MG tablet Take 1 tablet by mouth daily to prevent alcohol cravings   sertraline (ZOLOFT) 100 MG tablet TAKE 1 TABLET BY MOUTH  DAILY FOR MOOD   zinc gluconate 50 MG tablet Take 50 mg by mouth daily.   No current facility-administered medications on file prior to visit.  Allergies:  Allergies  Allergen Reactions   Dilaudid [Hydromorphone Hcl]      Medical History:  Past Medical History:  Diagnosis Date   Acute alcoholic hepatitis 07/29/2016   Asthma    Prediabetes    Vitamin D deficiency    Family history- Reviewed and unchanged Social history- Reviewed and unchanged   Review of Systems:  ROS    Physical Exam: There were no vitals taken for  this visit. Wt Readings from Last 3 Encounters:  01/13/21 157 lb 6.4 oz (71.4 kg)  02/26/20 169 lb (76.7 kg)  01/08/20 170 lb 9.6 oz (77.4 kg)   General Appearance: Well nourished, in no apparent distress. Eyes: PERRLA, EOMs, conjunctiva no swelling or erythema Sinuses: No Frontal/maxillary tenderness ENT/Mouth: Ext aud canals clear, TMs without erythema, bulging. No erythema, swelling, or exudate on post pharynx.  Tonsils not swollen or erythematous. Hearing normal.  Neck: Supple, thyroid normal.  Respiratory: Respiratory effort normal, BS equal bilaterally without rales, rhonchi, wheezing or stridor.  Cardio: RRR with no MRGs. Brisk peripheral pulses without edema.  Abdomen: Soft, + BS.  Non tender, no guarding, rebound, hernias, masses. Lymphatics: Non tender without lymphadenopathy.  Musculoskeletal: Full ROM, 5/5 strength, {PSY - GAIT AND STATION:22860} gait Skin: Warm, dry without rashes, lesions, ecchymosis.  Neuro: Cranial nerves intact. No cerebellar symptoms.  Psych: Awake and oriented X 3, normal affect, Insight and Judgment appropriate.    Revonda Humphrey, NP 9:47 AM [facility]

## 2021-07-21 ENCOUNTER — Ambulatory Visit: Payer: No Typology Code available for payment source | Admitting: Nurse Practitioner

## 2021-08-21 ENCOUNTER — Other Ambulatory Visit: Payer: Self-pay | Admitting: Adult Health

## 2021-08-21 DIAGNOSIS — R5383 Other fatigue: Secondary | ICD-10-CM

## 2021-08-21 DIAGNOSIS — F411 Generalized anxiety disorder: Secondary | ICD-10-CM

## 2021-09-10 ENCOUNTER — Telehealth: Payer: No Typology Code available for payment source | Admitting: Family

## 2021-09-10 DIAGNOSIS — R6889 Other general symptoms and signs: Secondary | ICD-10-CM | POA: Diagnosis not present

## 2021-09-10 MED ORDER — OSELTAMIVIR PHOSPHATE 75 MG PO CAPS: 75.0000 mg | ORAL_CAPSULE | Freq: Two times a day (BID) | ORAL | 0 refills | Status: DC

## 2021-09-10 NOTE — Progress Notes (Signed)

## 2021-10-12 ENCOUNTER — Other Ambulatory Visit: Payer: Self-pay | Admitting: Adult Health

## 2021-10-12 DIAGNOSIS — F1020 Alcohol dependence, uncomplicated: Secondary | ICD-10-CM

## 2021-11-03 ENCOUNTER — Encounter: Payer: Self-pay | Admitting: Internal Medicine

## 2022-01-14 ENCOUNTER — Encounter: Payer: Self-pay | Admitting: Internal Medicine

## 2022-01-14 NOTE — Progress Notes (Addendum)
? ? ? ?Annual  Screening/Preventative Visit  ?& Comprehensive Evaluation & Examination ? ?Future Appointments  ?Date Time Provider Department  ?01/15/2022 10:00 AM Unk Pinto, MD [facility]  ?01/19/2023 10:00 AM Unk Pinto, MD [facility]  ? ?    ?     This very nice 31 y.o. single WM presents for a Screening /Preventative Visit & comprehensive evaluation and management of multiple medical co-morbidities.  Patient has been followed for labile HTN, HLD, Prediabetes and Vitamin D Deficiency. Patient also requests to see a Sleep specialist. He's uncertain if issues with apnea, reports poor sleep hygiene with restless sleep. Reports very vivid of  dreaming. Reports sporadic & shallow breathing , but not sure if apneic episodes. Never feels rested.  ? ? ?    Patient also reports pains/tenderness of the Rt knee inferior to the patella. Says Naproxen helps. Denies injury or identified strain.  ? ?    Patient also has hx/o polysubstance abuse with Alcohol &  Opioids  with  completion of a rehab program in 2017 and attends AA and has remained drug & alcohol free since Nov 2017.  ? ? ?    Patient has been followed for labile HTN since 2019.  Patient's BP has been controlled and today's BP is at goal - 128/80.   Patient denies any cardiac symptoms as chest pain, palpitations, shortness of breath, dizziness or ankle swelling. ? ? ?    Patient's hyperlipidemia is not controlled with diet. Last lipids were  not at goal : ? ?Lab Results  ?Component Value Date  ? CHOL 187 01/13/2021  ? HDL 40 01/13/2021  ? LDLCALC 123 (H) 01/13/2021  ? TRIG 127 01/13/2021  ? CHOLHDL 4.7 01/13/2021  ? ? ? ?    Patient has hx/o prediabetes (A1c 5.7% /2013) and patient denies reactive hypoglycemic symptoms, visual blurring, diabetic polys or paresthesias. Last A1c was normal & at goal : ?  ?Lab Results  ?Component Value Date  ? HGBA1C 5.3 01/13/2021  ?  ? ? ?    Finally, patient has history of Vitamin D Deficiency ("23" /2012)  and last  vitamin D was ?  ?Lab Results  ?Component Value Date  ? VD25OH 95 01/13/2021  ? ? ? ?Current Outpatient Medications on File Prior to Visit  ?Medication Sig  ? VITAMIN C  Take 1 tablet  daily.  ? aspirin EC 81 MG tablet Take daily  ? VITAMIN D 5000 units CAPS Takes 1 capsule daily  ? VITAMIN B-12 tab Take 1 tablet   ? naltrexone (DEPADE) 50 MG tablet take 1 tablet daily   ? sertraline  100 MG tablet TAKE 1 TABLET  DAILY   ? zinc 50 MG tablet Take  daily.  ? ? ?Allergies  ?Allergen Reactions  ? Dilaudid [Hydromorphone Hcl]   ? ? ? ?Past Medical History:  ?Diagnosis Date  ? Acute alcoholic hepatitis [id number]  ? Asthma   ? Prediabetes   ? Vitamin D deficiency   ? ? ? ?Health Maintenance  ?Topic Date Due  ? COVID-19 Vaccine (4 - Booster for [place] series) 09/04/2020  ? INFLUENZA VACCINE  04/21/2022  ? TETANUS/TDAP  08/21/2022  ? Hepatitis C Screening  Completed  ? HIV Screening  Completed  ? HPV VACCINES  Aged Out  ? ? ? ?Immunization History  ?Administered Date(s) Administered  ? Influenza,inj,Quad  08/09/2018  ? Influenza,inj,quad 08/06/2016  ? Influenza 08/12/2020  ? PFIZER SARS-COV-2 Vacc 12/07/2019, 01/01/2020, 07/10/2020  ? PPD Test  11/02/2018, 01/08/2020, 01/13/2021  ? Td 08/21/2012  ? Td (Adult) 09/20/2012  ? ? ?Past Surgical History:  ?Procedure Laterality Date  ? ADENOIDECTOMY    ? CLOSED REDUCTION FOREARM FRACTURE Left 2005  ? INGUINAL HERNIA REPAIR  2003  ? TONSILLECTOMY    ? ? ? ?Family History  ?Problem Relation Age of Onset  ? Heart disease Mother   ? Heart attack Other   ? Arthritis Other   ? Hypertension Other   ? Diabetes Other   ? Stroke Other   ? Alcohol abuse Paternal Grandfather   ? ? ? ?Social  ?Marital status: Single   ?    Spouse  None  ?                  children: None  ?Occupational History  ? Not on file  ?History  ? ?Tobacco Use  ? Smoking status: Former  ?  Types: Cigarettes  ?  Quit date: 07/26/2016  ?  Years since quitting: 5.4  ? Smokeless tobacco: Never  ?Substance Use Topics  ? Alcohol  use: No  ?  Comment: 9mo sober  ? Drug use: No  ? ?      Sexual activity: Active monogamous heterosexual  relationship  ?  ? ? ? ROS ?Constitutional: Denies fever, chills, weight loss/gain, headaches, insomnia,  night sweats or change in appetite. Does c/o fatigue. ?Eyes: Denies redness, blurred vision, diplopia, discharge, itchy or watery eyes.  ?ENT: Denies discharge, congestion, post nasal drip, epistaxis, sore throat, earache, hearing loss, dental pain, Tinnitus, Vertigo, Sinus pain or snoring.  ?Cardio: Denies chest pain, palpitations, irregular heartbeat, syncope, dyspnea, diaphoresis, orthopnea, PND, claudication or edema ?Respiratory: denies cough, dyspnea, DOE, pleurisy, hoarseness, laryngitis or wheezing.  ?Gastrointestinal: Denies dysphagia, heartburn, reflux, water brash, pain, cramps, nausea, vomiting, bloating, diarrhea, constipation, hematemesis, melena, hematochezia, jaundice or hemorrhoids ?Genitourinary: Denies dysuria, frequency, urgency, nocturia, hesitancy, discharge, hematuria or flank pain ?Musculoskeletal: Denies arthralgia, myalgia, stiffness, Jt. Swelling, pain, limp or strain/sprain. Denies Falls. ?Skin: Denies puritis, rash, hives, warts, acne, eczema or change in skin lesion ?Neuro: No weakness, tremor, incoordination, spasms, paresthesia or pain ?Psychiatric: Denies confusion, memory loss or sensory loss. Denies Depression. ?Endocrine: Denies change in weight, skin, hair change, nocturia, and paresthesia, diabetic polys, visual blurring or hyper / hypo glycemic episodes.  ?Heme/Lymph: No excessive bleeding, bruising or enlarged lymph nodes. ? ? ?Physical Exam ? ?BP 128/80   Pulse 72   Temp 97.6 ?F (36.4 ?C)   Resp 17   Ht 5' 10.5" (1.791 m)   Wt 158 lb 9.6 oz (71.9 kg)   SpO2 96%   BMI 22.44 kg/m?  ? ?General Appearance: Well nourished and well groomed and in no apparent distress. ? ?Eyes: PERRLA, EOMs, conjunctiva no swelling or erythema, normal fundi and vessels. ?Sinuses: No  frontal/maxillary tenderness ?ENT/Mouth: EACs patent / TMs  nl. Nares clear without erythema, swelling, mucoid exudates. Oral hygiene is good. No erythema, swelling, or exudate. Tongue normal, non-obstructing. Tonsils not swollen or erythematous. Hearing normal.  ?Neck: Supple, thyroid not palpable. No bruits, nodes or JVD. ?Respiratory: Respiratory effort normal.  BS equal and clear bilateral without rales, rhonci, wheezing or stridor. ?Cardio: Heart sounds are normal with regular rate and rhythm and no murmurs, rubs or gallops. Peripheral pulses are normal and equal bilaterally without edema. No aortic or femoral bruits. ?Chest: symmetric with normal excursions and percussion.  ?Abdomen: Soft, with Nl bowel sounds. Nontender, no guarding, rebound, hernias, masses, or organomegaly.  ?  Lymphatics: Non tender without lymphadenopathy.  ?Musculoskeletal: Full ROM all peripheral extremities, joint stability, 5/5 strength, and normal gait. ?                              Tender over Rt inferior patellar tendon.  ?Skin: Warm and dry without rashes, lesions, cyanosis, clubbing or  ecchymosis.  ?Neuro: Cranial nerves intact, reflexes equal bilaterally. Normal muscle tone, no cerebellar symptoms. Sensation intact.  ?Pysch: Alert and oriented X 3 with normal affect, insight and judgment appropriate.  ? ?Assessment and Plan ? ?1. Annual Preventative/Screening Exam  ? ? ?2. Labile hypertension ? ?- EKG 12-Lead ?- Urinalysis, Routine w reflex microscopic ?- Microalbumin / creatinine urine ratio ?- COMPLETE METABOLIC PANEL WITH GFR ?- TSH ? ?3. Hyperlipidemia, mixed ? ?- EKG 12-Lead ?- COMPLETE METABOLIC PANEL WITH GFR ?- Lipid panel ?- TSH ? ?4. Abnormal glucose ? ?- EKG 12-Lead ?- COMPLETE METABOLIC PANEL WITH GFR ? ?5. Vitamin D deficiency ? ?- VITAMIN D 25 Hydroxy  ? ?6. Prediabetes ? ? ?7. Screening-pulmonary TB ? ?- TB Skin Test ? ?8. Screening for colorectal cancer ? ?- EKG 12-Lead ?- POC Hemoccult Bld/Stl  ?9. Screening for  ischemic heart disease ? ?- EKG 12-Lead ?- COMPLETE METABOLIC PANEL WITH GFR ? ?10. Screening for heart disease ? ?- COMPLETE METABOLIC PANEL WITH GFR ? ?11. FHx: heart disease ? ?- EKG 12-Lead ?- COMPLETE METAB

## 2022-01-14 NOTE — Patient Instructions (Signed)
Due to recent changes in healthcare laws, you may see the results of your imaging and laboratory studies on MyChart before your provider has had a chance to review them.  We understand that in some cases there may be results that are confusing or concerning to you. Not all laboratory results come back in the same time frame and the provider may be waiting for multiple results in order to interpret others.  Please give us 48 hours in order for your provider to thoroughly review all the results before contacting the office for clarification of your results.  ? ?+++++++++++++++++++++++++++++++++ ? Vit D  & ?Vit C 1,000 mg   ?are recommended to help protect  ?against the Covid-19 and other Corona viruses.  ? ? Also it's recommended  ?to take  ?Zinc 50 mg  ?to help  ?protect against the Covid-19   ?and best place to get ? is also on [employer]  ?and don't pay more than 6-8 cents /pill !  ?================================= ?Coronavirus (COVID-19) Are you at risk? ? ?Are you at risk for the Coronavirus (COVID-19)? ? ?To be considered HIGH RISK for Coronavirus (COVID-19), you have to meet the following criteria: ? ?Traveled to [place], [place], [place], [place] or [place]; or in the [place] to [place], [place], [place]  ?or [place]; and have fever, cough, and shortness of breath within the last 2 weeks of travel OR ?Been in close contact with a person diagnosed with COVID-19 within the last 2 weeks and have  ?fever, cough,and shortness of breath ? ?IF YOU DO NOT MEET THESE CRITERIA, YOU ARE CONSIDERED LOW RISK FOR COVID-19. ? ?What to do if you are HIGH RISK for COVID-19? ? ?If you are having a medical emergency, call 911. ?Seek medical care right away. Before you go to a doctor?s office, urgent care or emergency department, ? call ahead and tell them about your recent travel, contact with someone diagnosed with COVID-19  ? and your symptoms.  ?You should receive instructions from your physician?s office  regarding next steps of care.  ?When you arrive at healthcare provider, tell the healthcare staff immediately you have returned from  ?visiting [place], [place], [place], [place] or [place]; or traveled in the [place] to [place], [place],  ?[place] or [place] in the last two weeks or you have been in close contact with a person diagnosed with  ?COVID-19 in the last 2 weeks.   ?Tell the health care staff about your symptoms: fever, cough and shortness of breath. ?After you have been seen by a medical provider, you will be either: ?Tested for (COVID-19) and discharged home on quarantine except to seek medical care if  ?symptoms worsen, and asked to  ?Stay home and avoid contact with others until you get your results (4-5 days)  ?Avoid travel on public transportation if possible (such as bus, train, or airplane) or ?Sent to the Emergency Department by EMS for evaluation, COVID-19 testing  and  ?possible admission depending on your condition and test results. ? ?What to do if you are LOW RISK for COVID-19? ? ?Reduce your risk of any infection by using the same precautions used for avoiding the common cold or flu:  ?Wash your hands often with soap and warm water for at least 20 seconds.  If soap and water are not readily available,  ?use an alcohol-based hand sanitizer with at least 60% alcohol.  ?If coughing or sneezing, cover your mouth and nose by coughing   or sneezing into the elbow areas of your shirt or coat, ? into a tissue or into your sleeve (not your hands). ?Avoid shaking hands with others and consider head nods or verbal greetings only. ?Avoid touching your eyes, nose, or mouth with unwashed hands.  ?Avoid close contact with people who are sick. ?Avoid places or events with large numbers of people in one location, like concerts or sporting events. ?Carefully consider travel plans you have or are making. ?If you are planning any travel outside or inside the [place], visit the CDC?s Travelers? Health  webpage for the latest health notices. ?If you have some symptoms but not all symptoms, continue to monitor at home and seek medical attention  ?if your symptoms worsen. ?If you are having a medical emergency, call 911. ?>>>>>>>>>>>>>>>>>>>>>>>> ?Preventive Care for Adults ? ?A healthy lifestyle and preventive care can promote health and wellness. Preventive health guidelines for men include the following key practices: ?A routine yearly physical is a good way to check with your health care provider about your health and preventative screening. It is a chance to share any concerns and updates on your health and to receive a thorough exam. ?Visit your dentist for a routine exam and preventative care every 6 months. Brush your teeth twice a day and floss once a day. Good oral hygiene prevents tooth decay and gum disease. ?The frequency of eye exams is based on your age, health, family medical history, use of contact lenses, and other factors. Follow your health care provider's recommendations for frequency of eye exams. ?Eat a healthy diet. Foods such as vegetables, fruits, whole grains, low-fat dairy products, and lean protein foods contain the nutrients you need without too many calories. Decrease your intake of foods high in solid fats, added sugars, and salt. Eat the right amount of calories for you. Get information about a proper diet from your health care provider, if necessary. ?Regular physical exercise is one of the most important things you can do for your health. Most adults should get at least 150 minutes of moderate-intensity exercise (any activity that increases your heart rate and causes you to sweat) each week. In addition, most adults need muscle-strengthening exercises on 2 or more days a week. ?Maintain a healthy weight. The body mass index (BMI) is a screening tool to identify possible weight problems. It provides an estimate of body fat based on height and weight. Your health care provider can  find your BMI and can help you achieve or maintain a healthy weight. For adults 20 years and older: ?A BMI below 18.5 is considered underweight. ?A BMI of 18.5 to 24.9 is normal. ?A BMI of 25 to 29.9 is considered overweight. ?A BMI of 30 and above is considered obese. ?Maintain normal blood lipids and cholesterol levels by exercising and minimizing your intake of saturated fat. Eat a balanced diet with plenty of fruit and vegetables. Blood tests for lipids and cholesterol should begin at age 20 and be repeated every 5 years. If your lipid or cholesterol levels are high, you are over 50, or you are at high risk for heart disease, you may need your cholesterol levels checked more frequently. Ongoing high lipid and cholesterol levels should be treated with medicines if diet and exercise are not working. ?If you smoke, find out from your health care provider how to quit. If you do not use tobacco, do not start. ?Lung cancer screening is recommended for adults aged 55-80 years who are at high risk for   developing lung cancer because of a history of smoking. A yearly low-dose CT scan of the lungs is recommended for people who have at least a 30-pack-year history of smoking and are a current smoker or have quit within the past 15 years. A pack year of smoking is smoking an average of 1 pack of cigarettes a day for 1 year (for example: 1 pack a day for 30 years or 2 packs a day for 15 years). Yearly screening should continue until the smoker has stopped smoking for at least 15 years. Yearly screening should be stopped for people who develop a health problem that would prevent them from having lung cancer treatment. ?If you choose to drink alcohol, do not have more than 2 drinks per day. One drink is considered to be 12 ounces (355 mL) of beer, 5 ounces (148 mL) of wine, or 1.5 ounces (44 mL) of liquor. ?High blood pressure causes heart disease and increases the risk of stroke. Your blood pressure should be checked. Ongoing  high blood pressure should be treated with medicines, if weight loss and exercise are not effective. ?If you are 45-79 years old, ask your health care provider if you should take aspirin to prevent heart disease.

## 2022-01-15 ENCOUNTER — Encounter: Payer: Self-pay | Admitting: Internal Medicine

## 2022-01-15 ENCOUNTER — Ambulatory Visit: Payer: No Typology Code available for payment source | Admitting: Internal Medicine

## 2022-01-15 VITALS — BP 128/80 | HR 72 | Temp 97.6°F | Resp 17 | Ht 70.5 in | Wt 158.6 lb

## 2022-01-15 DIAGNOSIS — R7309 Other abnormal glucose: Secondary | ICD-10-CM

## 2022-01-15 DIAGNOSIS — E782 Mixed hyperlipidemia: Secondary | ICD-10-CM

## 2022-01-15 DIAGNOSIS — R0989 Other specified symptoms and signs involving the circulatory and respiratory systems: Secondary | ICD-10-CM

## 2022-01-15 DIAGNOSIS — Z0000 Encounter for general adult medical examination without abnormal findings: Secondary | ICD-10-CM

## 2022-01-15 DIAGNOSIS — Z136 Encounter for screening for cardiovascular disorders: Secondary | ICD-10-CM

## 2022-01-15 DIAGNOSIS — M7651 Patellar tendinitis, right knee: Secondary | ICD-10-CM

## 2022-01-15 DIAGNOSIS — Z79899 Other long term (current) drug therapy: Secondary | ICD-10-CM

## 2022-01-15 DIAGNOSIS — R5383 Other fatigue: Secondary | ICD-10-CM

## 2022-01-15 DIAGNOSIS — Z8249 Family history of ischemic heart disease and other diseases of the circulatory system: Secondary | ICD-10-CM

## 2022-01-15 DIAGNOSIS — R7303 Prediabetes: Secondary | ICD-10-CM

## 2022-01-15 DIAGNOSIS — E559 Vitamin D deficiency, unspecified: Secondary | ICD-10-CM

## 2022-01-15 DIAGNOSIS — Z111 Encounter for screening for respiratory tuberculosis: Secondary | ICD-10-CM | POA: Diagnosis not present

## 2022-01-15 DIAGNOSIS — Z1211 Encounter for screening for malignant neoplasm of colon: Secondary | ICD-10-CM

## 2022-01-15 DIAGNOSIS — Z0001 Encounter for general adult medical examination with abnormal findings: Secondary | ICD-10-CM

## 2022-01-15 DIAGNOSIS — G4750 Parasomnia, unspecified: Secondary | ICD-10-CM

## 2022-01-15 MED ORDER — MELOXICAM 15 MG PO TABS: ORAL_TABLET | ORAL | 3 refills | Status: DC

## 2022-01-16 LAB — CBC WITH DIFFERENTIAL/PLATELET
Absolute Monocytes: 409 cells/uL (ref 200–950)
Basophils Absolute: 62 cells/uL (ref 0–200)
Basophils Relative: 1.1 %
Eosinophils Absolute: 168 cells/uL (ref 15–500)
Eosinophils Relative: 3 %
HCT: 44.8 % (ref 38.5–50.0)
Hemoglobin: 14.8 g/dL (ref 13.2–17.1)
Lymphs Abs: 1954 cells/uL (ref 850–3900)
MCH: 28.9 pg (ref 27.0–33.0)
MCHC: 33 g/dL (ref 32.0–36.0)
MCV: 87.5 fL (ref 80.0–100.0)
MPV: 12 fL (ref 7.5–12.5)
Monocytes Relative: 7.3 %
Neutro Abs: 3007 cells/uL (ref 1500–7800)
Neutrophils Relative %: 53.7 %
Platelets: 259 10*3/uL (ref 140–400)
RBC: 5.12 10*6/uL (ref 4.20–5.80)
RDW: 12.7 % (ref 11.0–15.0)
Total Lymphocyte: 34.9 %
WBC: 5.6 10*3/uL (ref 3.8–10.8)

## 2022-01-16 LAB — COMPLETE METABOLIC PANEL WITH GFR
AG Ratio: 2.1 (calc) (ref 1.0–2.5)
ALT: 12 U/L (ref 9–46)
AST: 15 U/L (ref 10–40)
Albumin: 4.7 g/dL (ref 3.6–5.1)
Alkaline phosphatase (APISO): 64 U/L (ref 36–130)
BUN: 11 mg/dL (ref 7–25)
CO2: 27 mmol/L (ref 20–32)
Calcium: 9.9 mg/dL (ref 8.6–10.3)
Chloride: 104 mmol/L (ref 98–110)
Creat: 1.06 mg/dL (ref 0.60–1.26)
Globulin: 2.2 g/dL (calc) (ref 1.9–3.7)
Glucose, Bld: 87 mg/dL (ref 65–99)
Potassium: 3.9 mmol/L (ref 3.5–5.3)
Sodium: 141 mmol/L (ref 135–146)
Total Bilirubin: 0.3 mg/dL (ref 0.2–1.2)
Total Protein: 6.9 g/dL (ref 6.1–8.1)
eGFR: 97 mL/min/{1.73_m2} (ref >=60)

## 2022-01-16 LAB — LIPID PANEL
Cholesterol: 183 mg/dL (ref <200)
HDL: 45 mg/dL (ref >=40)
LDL Cholesterol (Calc): 107 mg/dL (calc) — ABNORMAL HIGH
Non-HDL Cholesterol (Calc): 138 mg/dL (calc) — ABNORMAL HIGH (ref <130)
Total CHOL/HDL Ratio: 4.1 (calc) (ref <5.0)
Triglycerides: 184 mg/dL — ABNORMAL HIGH (ref <150)

## 2022-01-16 LAB — MICROALBUMIN / CREATININE URINE RATIO
Creatinine, Urine: 182 mg/dL (ref 20–320)
Microalb Creat Ratio: 4 mcg/mg creat (ref <30)
Microalb, Ur: 0.7 mg/dL

## 2022-01-16 LAB — URINALYSIS, ROUTINE W REFLEX MICROSCOPIC
Bilirubin Urine: NEGATIVE
Glucose, UA: NEGATIVE
Hgb urine dipstick: NEGATIVE
Ketones, ur: NEGATIVE
Leukocytes,Ua: NEGATIVE
Nitrite: NEGATIVE
Protein, ur: NEGATIVE
Specific Gravity, Urine: 1.021 (ref 1.001–1.035)
pH: 6 (ref 5.0–8.0)

## 2022-01-16 LAB — VITAMIN B12: Vitamin B-12: 611 pg/mL (ref 200–1100)

## 2022-01-16 LAB — HEMOGLOBIN A1C
Hgb A1c MFr Bld: 5.3 % of total Hgb (ref <5.7)
Mean Plasma Glucose: 105 mg/dL
eAG (mmol/L): 5.8 mmol/L

## 2022-01-16 LAB — MAGNESIUM: Magnesium: 2.1 mg/dL (ref 1.5–2.5)

## 2022-01-16 LAB — IRON, TOTAL/TOTAL IRON BINDING CAP
%SAT: 19 % (calc) — ABNORMAL LOW (ref 20–48)
Iron: 64 ug/dL (ref 50–180)
TIBC: 335 mcg/dL (calc) (ref 250–425)

## 2022-01-16 LAB — TESTOSTERONE: Testosterone: 362 ng/dL (ref 250–827)

## 2022-01-16 LAB — INSULIN, RANDOM: Insulin: 10 u[IU]/mL

## 2022-01-16 LAB — VITAMIN D 25 HYDROXY (VIT D DEFICIENCY, FRACTURES): Vit D, 25-Hydroxy: 124 ng/mL — ABNORMAL HIGH (ref 30–100)

## 2022-01-16 LAB — TSH: TSH: 1.71 mIU/L (ref 0.40–4.50)

## 2022-03-16 ENCOUNTER — Ambulatory Visit: Payer: No Typology Code available for payment source | Admitting: Neurology

## 2022-03-16 ENCOUNTER — Encounter: Payer: Self-pay | Admitting: Neurology

## 2022-03-16 VITALS — BP 133/92 | HR 66 | Ht 70.0 in | Wt 159.0 lb

## 2022-03-16 DIAGNOSIS — G478 Other sleep disorders: Secondary | ICD-10-CM | POA: Insufficient documentation

## 2022-03-16 DIAGNOSIS — G4763 Sleep related bruxism: Secondary | ICD-10-CM | POA: Diagnosis not present

## 2022-03-16 DIAGNOSIS — R065 Mouth breathing: Secondary | ICD-10-CM | POA: Insufficient documentation

## 2022-03-16 DIAGNOSIS — G4719 Other hypersomnia: Secondary | ICD-10-CM

## 2022-03-16 DIAGNOSIS — G4726 Circadian rhythm sleep disorder, shift work type: Secondary | ICD-10-CM

## 2022-03-16 DIAGNOSIS — F1994 Other psychoactive substance use, unspecified with psychoactive substance-induced mood disorder: Secondary | ICD-10-CM

## 2022-04-16 ENCOUNTER — Telehealth: Payer: Self-pay | Admitting: Neurology

## 2022-04-16 NOTE — Telephone Encounter (Signed)
[employer] pending uploaded notes on the portal  

## 2022-04-28 NOTE — Telephone Encounter (Signed)
[employer] denied the NPSG  HST-No auth req'd per RR R. ref#04/22/22 RR R.  Patient is scheduled at [facility] for 05/19/22 at 11 AM.  Mailed packet to the patient.

## 2022-05-19 ENCOUNTER — Ambulatory Visit: Payer: No Typology Code available for payment source | Admitting: Neurology

## 2022-05-19 DIAGNOSIS — G478 Other sleep disorders: Secondary | ICD-10-CM

## 2022-05-19 DIAGNOSIS — R065 Mouth breathing: Secondary | ICD-10-CM

## 2022-05-19 DIAGNOSIS — G4726 Circadian rhythm sleep disorder, shift work type: Secondary | ICD-10-CM

## 2022-05-19 DIAGNOSIS — G4719 Other hypersomnia: Secondary | ICD-10-CM

## 2022-05-19 DIAGNOSIS — Z87898 Personal history of other specified conditions: Secondary | ICD-10-CM

## 2022-05-19 DIAGNOSIS — G4710 Hypersomnia, unspecified: Secondary | ICD-10-CM | POA: Diagnosis not present

## 2022-05-19 DIAGNOSIS — G4763 Sleep related bruxism: Secondary | ICD-10-CM

## 2022-05-25 NOTE — Procedures (Signed)
[facility] SLEEP TEST REPORT ( by Watch PAT)   STUDY DATA:  05-22-2022 DOB:  08-05-2022 MRN: [id number]   ORDERING CLINICIAN: Melvyn Novas, MD  REFERRING CLINICIAN:    CLINICAL INFORMATION/HISTORY: Manuel Thomas is a 31 y.o. Caucasian male patient seen here upon Dr. Michaelle Birks referral on 03/16/2022 . Chief concern according to patient : " I sleep long hours uninterrupted but never feel refreshed, snore but I am a mouth breather, nearly all my life".  Had worn braces and retainers. Teeth grinding is reported , he is now a mouth guard wearer. Vivid dreams were reported, but no acting out. Manuel Thomas has a past medical history of: Acute alcoholic hepatitis, Asthma, Prediabetes, and Vitamin D deficiency. He has been sober since 11/ 2017. Depression and anxiety were endorsed. No previous sleep study.   Sleep relevant medical history: Tonsillectomy at age 62-43 years of age-, bruxism.  Family medical /sleep history: his maternal GF had OSA.     Epworth sleepiness score: 13/24. Fatigue Severity Score( FSS) endorsed at :42/ 63 points ( elevated )    BMI: 22.7 kg/m   Neck Circumference: 15"   FINDINGS:   Sleep Summary:   Total Recording Time (hours, min): 8 hours 3 minutes        Total Sleep Time (hours, min): 7 hours 12 minutes               Percent REM (%):      28.4%                            Respiratory Indices:   Calculated pAHI (per hour): 2.4/h                            REM pAHI:    6.4/h     REM RDI was 12.9/h                                        NREM pAHI:   0.8/h    non-REM RDI was 2.8/h                       Positional AHI: The patient slept 211 minutes in supine sleep associated with an AHI of 2.3/h, 102 minutes in prone sleep associated with an AHI of 2.4/h, 93.5 minutes on the right side with an AHI of 3.2/h and 25 minutes on the left with an AHI of 0.  Snoring statistics show a mean volume of 41 dB which is just above threshold.  However this  audible breathing was present for 63% of the total sleep time.  The RDI was significantly higher than the AHI sleeping in all sleep positions.                                                 Oxygen Saturation Statistics:   O2 Saturation Range (%): Between a nadir of 90% and a maximum of 99% with a mean saturation at 95%  O2 Saturation (minutes) <89%:     0 minutes      Pulse Rate Statistics:   Pulse Mean (bpm):   58 bpm              Pulse Range:   Between a minimum of 42 and a maximum of 119 bpm, indicating intermittent bradycardia.  This home sleep test does not provide data about heart rhythm.              IMPRESSION:  This HST confirms the presence of snoring and upper airway resistance - the overall apnea hypopnea index was too low to justify treatment by CPAP.  There was no associated hypoxia.     RECOMMENDATION: [name] had reported nonrestorative sleep and vivid dreams.  Both his sleepiness score and his fatigue score were elevated.  He does wear a mouthguard to protect his teeth from bruxism.  He reports myoclonic jerks at night which cannot be traced in a home sleep test.  He also reports sudden sleep attacks, vivid dreams but has not reported sleep paralysis or cataplexy.  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  REM sleep latency was 146 minutes during this home sleep test and would not be considered suspicious of narcolepsy.   The movement concern needs to be addressed in an attended sleep study.   Given the patient's work hours and his experience of sudden severe sleepiness I could recommend a treatment with modafinil for sleepiness.   He would not qualify for CPAP based on these HST data.    INTERPRETING PHYSICIAN:   Melvyn Novas, MD   Medical Director of [facility]

## 2022-05-25 NOTE — Addendum Note (Signed)
Addended by: Melvyn Novas on: 05/25/2022 06:20 PM   Modules accepted: Orders

## 2022-05-26 ENCOUNTER — Telehealth: Payer: Self-pay | Admitting: *Deleted

## 2022-05-26 NOTE — Telephone Encounter (Signed)
-----   Message from Melvyn Novas, MD sent at 05/25/2022  6:20 PM EDT ----- Pulse Range: Between a minimum of 42 and a maximum of 119 bpm, indicating intermittent bradycardia.  This home sleep test does not provide data about heart rhythm.  IMPRESSION:  This HST confirms the presence of snoring and upper airway resistance - the overall apnea hypopnea index was too low to justify treatment by CPAP.  There was no associated hypoxia.    RECOMMENDATION: Manuel Thomas had reported nonrestorative sleep and vivid dreams.  Both his sleepiness score and his fatigue score were elevated.  He does wear a mouthguard to protect his teeth from bruxism.  He reports myoclonic jerks at night which cannot be traced in a home sleep test.  He also reports sudden sleep attacks, vivid dreams but has not reported sleep paralysis or cataplexy.  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  REM sleep latency was 146 minutes during this home sleep test and would not be considered suspicious of narcolepsy.   The movement concern needs to be addressed in an attended sleep study.   Given the patient's work hours and his experience of sudden severe sleepiness I could recommend a treatment with modafinil for sleepiness.   He would not qualify for CPAP based on these HST data.

## 2022-05-29 ENCOUNTER — Other Ambulatory Visit: Payer: Self-pay | Admitting: *Deleted

## 2022-05-31 MED ORDER — MODAFINIL 200 MG PO TABS
200.0000 mg | ORAL_TABLET | Freq: Every day | ORAL | 5 refills | Status: DC
Start: 2022-05-31 — End: 2022-12-08

## 2022-06-25 ENCOUNTER — Telehealth: Payer: Self-pay | Admitting: Neurology

## 2022-06-25 NOTE — Telephone Encounter (Signed)
[employer] all savers pending uploaded notes on the portal

## 2022-07-01 NOTE — Progress Notes (Signed)
Chief Complaint  Patient presents with   Follow-up    Pt in room #2 and alone. Pt here today for f/u on sleep schedule.    HISTORY OF PRESENT ILLNESS:  07/06/22 ALL:  Manuel Thomas is a 31 y.o. male here today for follow up for non restorative sleep and EDS. ESS 13/24. HST did not reveal sleep apnea. He was started on modafinil 200mg  daily. First filled in 05/2022. Since, he reports feeling a little better. She typically takes modafinil around 9-10am. He usually starts his shift around 12n. He feels that he is able to get about 7-9 hours of sleep. He continues to have some daytime sleepiness but feels it has improved on modafinil. He continues close follow up with PCP. He continues sertraline and naltrexone.    HISTORY (copied from Dr Dohmeier's previous note)  Manuel Thomas is a 31 y.o. Caucasian male patient seen here upon PCP referral on 03/16/2022 . Chief concern according to patient : " I sleep long hours uninterrupted but never feel refreshed, snore but I am a mouth breather, nearly all my life". Had worn braces and retainers. Teeth grinding, is now a mouth guard wearer. Vivid dreams were reported, but no acting out.    Manuel Thomas  has a past medical history of Acute alcoholic hepatitis (07/29/2016), Asthma, Prediabetes, and Vitamin D deficiency.  He has been sober since 11/ 2017. Depression and anxiety were endorsed. No previous sleep study.    Sleep relevant medical history: Tonsillectomy at age [age] years of age-, bruxism.  Family medical /sleep history: maternal GF had OSA.    Social history:  Patient is working as a 3-  and lives in a household with his GF. 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  ETOH use 07-2016, Caffeine intake in form of Coffee( /) Soda( 5-7/ day) Tea ( /) or energy drinks. Regular exercise in form of walking.   Hobbies : cooking   Sleep habits  are as follows: The patient's dinner time is between 10 PM. The patient goes to bed at 1 AM and continues to sleep for 8 hours, wakes  rarely for bathroom breaks.   The preferred sleep position is side or prone, with the support of 1-2 pillows.  Dreams are reportedly now infrequent/vivid.  8-9  AM is the usual rise time. The patient wakes up with several alarms- he can easily sleep through .  He reports not feeling refreshed or restored in AM, with symptoms such as dry mouth, morning headaches, and residual fatigue.  Naps are taken frequently on days-off, lasting from 2-3 hours and are not more refreshing than nocturnal sleep.    REVIEW OF SYSTEMS: Out of a complete 14 system review of symptoms, the patient complains only of the following symptoms, excessive daytime sleepiness and all other reviewed systems are negative.  ESS:  13/24,  previously 13/24  ALLERGIES: Allergies  Allergen Reactions   Dilaudid [Hydromorphone Hcl]      HOME MEDICATIONS: Outpatient Medications Prior to Visit  Medication Sig Dispense Refill   Ascorbic Acid (VITAMIN C PO) Take 1 tablet by mouth daily.     aspirin EC 81 MG tablet Take 81 mg by mouth daily. Swallow whole.     Cholecalciferol (VITAMIN D3) 5000 units CAPS Takes 1 capsule daily     Cyanocobalamin (VITAMIN B-12 PO) Take 1 tablet by mouth.  meloxicam (MOBIC) 15 MG tablet Take  1/2 to 1 tablet  Daily with Food  for Pain & Inflammation 90 tablet 3   modafinil (PROVIGIL) 200 MG tablet Take 1 tablet (200 mg total) by mouth daily. 30 tablet 5   naltrexone (DEPADE) 50 MG tablet take 1 tablet by mouth daily to prevent alcohol cravings 90 tablet 3   sertraline (ZOLOFT) 100 MG tablet TAKE 1 TABLET BY MOUTH  DAILY FOR MOOD 90 tablet 3   zinc gluconate 50 MG tablet Take 50 mg by mouth daily.     No facility-administered medications prior to visit.     PAST MEDICAL HISTORY: Past Medical History:  Diagnosis Date   Acute alcoholic hepatitis 74/0/8144    Asthma    Prediabetes    Vitamin D deficiency      PAST SURGICAL HISTORY: Past Surgical History:  Procedure Laterality Date   ADENOIDECTOMY     CLOSED REDUCTION FOREARM FRACTURE Left 2005   INGUINAL HERNIA REPAIR  2003   TONSILLECTOMY       FAMILY HISTORY: Family History  Problem Relation Age of Onset   Heart disease Mother    Heart attack Other    Arthritis Other    Hypertension Other    Diabetes Other    Stroke Other    Alcohol abuse Paternal Grandfather      SOCIAL HISTORY: Social History   Socioeconomic History   Marital status: Single    Spouse name: Not on file   Number of children: Not on file   Years of education: Not on file   Highest education level: Not on file  Occupational History   Not on file  Tobacco Use   Smoking status: Former    Types: Cigarettes    Quit date: 07/26/2016    Years since quitting: 5.9   Smokeless tobacco: Never  Substance and Sexual Activity   Alcohol use: No    Comment: 32mo sober   Drug use: No   Sexual activity: Not Currently  Other Topics Concern   Not on file  Social History Narrative   Not on file   Social Determinants of Health   Financial Resource Strain: Not on file  Food Insecurity: Not on file  Transportation Needs: Not on file  Physical Activity: Not on file  Stress: Not on file  Social Connections: Not on file  Intimate Partner Violence: Not on file     PHYSICAL EXAM  Vitals:   07/06/22 0955  BP: 107/63  Pulse: 62  Weight: 155 lb 8 oz (70.5 kg)  Height: 5\' 11"  (1.803 m)   Body mass index is 21.69 kg/m.  Generalized: Well developed, in no acute distress  Cardiology: normal rate and rhythm, no murmur auscultated  Respiratory: clear to auscultation bilaterally    Neurological examination  Mentation: Alert oriented to time, place, history taking. Follows all commands speech and language fluent Cranial nerve II-XII: Pupils were equal round reactive to light. Extraocular movements were full,  visual field were full on confrontational test. Facial sensation and strength were normal. Uvula tongue midline. Head turning and shoulder shrug  were normal and symmetric. Motor: The motor testing reveals 5 over 5 strength of all 4 extremities. Good symmetric motor tone is noted throughout.  Sensory: Sensory testing is intact to soft touch on all 4 extremities. No evidence of extinction is noted.  Coordination: Cerebellar testing reveals good finger-nose-finger and heel-to-shin bilaterally.  Gait and station: Gait is normal. Tandem gait is normal. Romberg is  negative. No drift is seen.  Reflexes: Deep tendon reflexes are symmetric and normal bilaterally.    DIAGNOSTIC DATA (LABS, IMAGING, TESTING) - I reviewed patient records, labs, notes, testing and imaging myself where available.  Lab Results  Component Value Date   WBC 5.6 01/15/2022   HGB 14.8 01/15/2022   HCT 44.8 01/15/2022   MCV 87.5 01/15/2022   PLT 259 01/15/2022      Component Value Date/Time   NA 141 01/15/2022 1006   K 3.9 01/15/2022 1006   CL 104 01/15/2022 1006   CO2 27 01/15/2022 1006   GLUCOSE 87 01/15/2022 1006   BUN 11 01/15/2022 1006   CREATININE 1.06 01/15/2022 1006   CALCIUM 9.9 01/15/2022 1006   PROT 6.9 01/15/2022 1006   ALBUMIN 4.3 01/06/2017 1121   AST 15 01/15/2022 1006   ALT 12 01/15/2022 1006   ALKPHOS 67 01/06/2017 1121   BILITOT 0.3 01/15/2022 1006   GFRNONAA 112 01/13/2021 1451   GFRAA 130 01/13/2021 1451   Lab Results  Component Value Date   CHOL 183 01/15/2022   HDL 45 01/15/2022   LDLCALC 107 (H) 01/15/2022   TRIG 184 (H) 01/15/2022   CHOLHDL 4.1 01/15/2022   Lab Results  Component Value Date   HGBA1C 5.3 01/15/2022   Lab Results  Component Value Date   VITAMINB12 611 01/15/2022   Lab Results  Component Value Date   TSH 1.71 01/15/2022        No data to display               No data to display           ASSESSMENT AND PLAN  31 y.o. year old male  has a  past medical history of Acute alcoholic hepatitis (07/29/2016), Asthma, Prediabetes, and Vitamin D deficiency. here with    Excessive daytime sleepiness  Sleep disorder, circadian, shift work type  Celine Ahr reports feeling somewhat better on modafinil 200mg  daily. He has taken for about a month. I will have him continue daily for the next 6 months. Refills are current. He will call when additional refills are needed. May consider MSLT testing. Sleep hygiene reviewed. Healthy lifestyle habits encouraged. He will follow up with PCP as directed. He will return to see Dr in 6 months, sooner if needed. He verbalizes understanding and agreement with this plan.   No orders of the defined types were placed in this encounter.    No orders of the defined types were placed in this encounter.    Vickey Huger, MSN, FNP-C 07/06/2022, 10:20 AM  [facility] [address], [address] [place], [place] [phone]

## 2022-07-01 NOTE — Patient Instructions (Signed)
Below is our plan:  We will continue modafinil 200mg  daily.   Please make sure you are staying well hydrated. I recommend 50-60 ounces daily. Well balanced diet and regular exercise encouraged. Consistent sleep schedule with 6-8 hours recommended.   Please continue follow up with care team as directed.   Follow up with Dr Brett Fairy in 6 months   You may receive a survey regarding today's visit. I encourage you to leave honest feed back as I do use this information to improve patient care. Thank you for seeing me today!

## 2022-07-06 ENCOUNTER — Ambulatory Visit: Payer: No Typology Code available for payment source | Admitting: Family Medicine

## 2022-07-06 ENCOUNTER — Encounter: Payer: Self-pay | Admitting: Family Medicine

## 2022-07-06 VITALS — BP 107/63 | HR 62 | Ht 71.0 in | Wt 155.5 lb

## 2022-07-06 DIAGNOSIS — G4719 Other hypersomnia: Secondary | ICD-10-CM

## 2022-07-06 DIAGNOSIS — G4726 Circadian rhythm sleep disorder, shift work type: Secondary | ICD-10-CM

## 2022-07-06 NOTE — Telephone Encounter (Signed)
Checked status on the portal it is still pending.  

## 2022-07-14 NOTE — Telephone Encounter (Signed)
[employer]- [employer] Josem Kaufmann: [id number] (exp. 06/25/22 to 09/18/22).  Patient is scheduled at [facility] for 09/01/22 at 9 pm.  Mailed packet to the patient.

## 2022-07-15 ENCOUNTER — Other Ambulatory Visit: Payer: Self-pay | Admitting: Internal Medicine

## 2022-07-15 DIAGNOSIS — F411 Generalized anxiety disorder: Secondary | ICD-10-CM

## 2022-07-15 DIAGNOSIS — R5383 Other fatigue: Secondary | ICD-10-CM

## 2022-07-15 MED ORDER — SERTRALINE HCL 100 MG PO TABS
ORAL_TABLET | ORAL | 3 refills | Status: DC
Start: 2022-07-15 — End: 2023-12-07

## 2022-07-20 ENCOUNTER — Ambulatory Visit (INDEPENDENT_AMBULATORY_CARE_PROVIDER_SITE_OTHER): Payer: No Typology Code available for payment source | Admitting: Nurse Practitioner

## 2022-07-20 VITALS — BP 110/68 | HR 66 | Temp 97.7°F | Ht 70.5 in | Wt 153.6 lb

## 2022-07-20 DIAGNOSIS — R0989 Other specified symptoms and signs involving the circulatory and respiratory systems: Secondary | ICD-10-CM

## 2022-07-20 DIAGNOSIS — H6122 Impacted cerumen, left ear: Secondary | ICD-10-CM

## 2022-07-20 DIAGNOSIS — E782 Mixed hyperlipidemia: Secondary | ICD-10-CM

## 2022-07-20 DIAGNOSIS — Z23 Encounter for immunization: Secondary | ICD-10-CM | POA: Diagnosis not present

## 2022-07-20 DIAGNOSIS — E559 Vitamin D deficiency, unspecified: Secondary | ICD-10-CM

## 2022-07-20 DIAGNOSIS — J45909 Unspecified asthma, uncomplicated: Secondary | ICD-10-CM

## 2022-07-20 DIAGNOSIS — F1994 Other psychoactive substance use, unspecified with psychoactive substance-induced mood disorder: Secondary | ICD-10-CM

## 2022-07-20 DIAGNOSIS — Z79899 Other long term (current) drug therapy: Secondary | ICD-10-CM

## 2022-07-20 DIAGNOSIS — G4726 Circadian rhythm sleep disorder, shift work type: Secondary | ICD-10-CM

## 2022-07-20 DIAGNOSIS — F411 Generalized anxiety disorder: Secondary | ICD-10-CM | POA: Diagnosis not present

## 2022-07-20 DIAGNOSIS — G4763 Sleep related bruxism: Secondary | ICD-10-CM

## 2022-07-20 DIAGNOSIS — G478 Other sleep disorders: Secondary | ICD-10-CM

## 2022-07-20 NOTE — Patient Instructions (Signed)

## 2022-07-20 NOTE — Addendum Note (Signed)
Addended by: Chancy Hurter on: 07/20/2022 10:18 AM   Modules accepted: Orders

## 2022-07-20 NOTE — Progress Notes (Signed)
FOLLOW UP  Assessment and Plan:   1. Labile hypertension Discussed DASH (Dietary Approaches to Stop Hypertension) DASH diet is lower in sodium than a typical American diet. Cut back on foods that are high in saturated fat, cholesterol, and trans fats. Eat more whole-grain foods, fish, poultry, and nuts Remain active and exercise as tolerated daily.  Monitor BP at home-Call if greater than 130/80.  Check CMP/CBC   2. Mixed hyperlipidemia Discussed lifestyle modifications. Recommended diet heavy in fruits and veggies, omega 3's. Decrease consumption of animal meats, cheeses, and dairy products. Remain active and exercise as tolerated. Continue to monitor. Check lipids/TSH   3. Uncomplicated asthma, unspecified asthma severity, unspecified whether persistent Controlled - resolved  No recent flare  4. Anxiety neurosis Continue Sertraline (Zoloft) Reviewed relaxation techniques.  Sleep hygiene. Recommended mindfulness meditation and exercise.   Psychoeducation:  encouraged personality growth wand development through coping techniques and problem-solving skills. Limit/Decrease/Monitor drug/alcohol intake.     5. Sleep related bruxism Continue to monitor   6. Non-restorative sleep Continue to monitor   7. Substance induced mood disorder (HCC) Continue to monitor Continue Naltrexone  8. Sleep disorder, circadian, shift work type Continue Naltrexone  9.  Vitamin D deficiency Continue supplement Continue to monitor  10.  Medication management All medications discussed and reviewed in full. All questions and concerns regarding medications addressed.    11.  Cerumen impaction Left ear(s) irrigated with lukewarm water, hydrogen peroxide. Large piece of brown cerumen x3 extracted with ear curette. TM:  WNL.  Pt tolerated procedure well.    Orders Placed This Encounter  Procedures   CBC with Differential/Platelet   COMPLETE METABOLIC PANEL WITH GFR   Lipid panel    VITAMIN D 25 Hydroxy (Vit-D Deficiency, Fractures)   Ear Lavage    Left Ear    Continue diet and meds as discussed. Further disposition pending results of labs. Discussed med's effects and SE's.    Over 20 minutes of exam, counseling, chart review, and critical decision making was performed.   Future Appointments  Date Time Provider Department Center  07/20/2022  9:30 AM Adela Glimpse, NP [facility] None  09/01/2022  9:00 PM [facility] None  01/05/2023 10:30 AM Dohmeier, Porfirio Mylar, MD [facility] None  01/19/2023 10:00 AM Lucky Cowboy, MD [facility] None    ----------------------------------------------------------------------------------------------------------------------  HPI 31 y.o. male  presents for 6 month follow up on hypertension, cholesterol, chronic alcohol dependence and vitamin D deficiency.   He follows [facility] for excessive daytime sleepiness. HST did not reveal sleep apnea.  He continues Modafinil 200 mg.   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  He has been sober since 07/2016.    Completed rehab 2017 and attends AA.  He had annual CPE 12/2021 with Dr. Posey Boyer.    BMI is There is no height or weight on file to calculate BMI., he has been working on diet and exercise. Wt Readings from Last 3 Encounters:  07/06/22 155 lb 8 oz (70.5 kg)  03/16/22 159 lb (72.1 kg)  01/15/22 158 lb 9.6 oz (71.9 kg)    His blood pressure has been controlled at home, today their BP is    He does workout. He denies chest pain, shortness of breath, dizziness.   He is not on cholesterol medication. His cholesterol  is not at goal. The cholesterol last visit was:   Lab Results  Component Value Date   CHOL 183 01/15/2022   HDL 45 01/15/2022   LDLCALC 107 (H) 01/15/2022   TRIG 184  (H) 01/15/2022   CHOLHDL 4.1 01/15/2022    He has been working on diet and exercise for prediabetes, and denies polydipsia and polyuria. Last A1C in the office was:  Lab Results  Component Value Date   HGBA1C 5.3 01/15/2022   Patient is on Vitamin D supplement.   Lab Results  Component Value Date   VD25OH 124 (H) 01/15/2022        Current Medications:  Current Outpatient Medications on File Prior to Visit  Medication Sig   Ascorbic Acid (VITAMIN C PO) Take 1 tablet by mouth daily.   aspirin EC 81 MG tablet Take 81 mg by mouth daily. Swallow whole.   Cholecalciferol (VITAMIN D3) 5000 units CAPS Takes 1 capsule daily   Cyanocobalamin (VITAMIN B-12 PO) Take 1 tablet by mouth.   meloxicam (MOBIC) 15 MG tablet Take  1/2 to 1 tablet  Daily with Food  for Pain & Inflammation   modafinil (PROVIGIL) 200 MG tablet Take 1 tablet (200 mg total) by mouth daily.   naltrexone (DEPADE) 50 MG tablet take 1 tablet by mouth daily to prevent alcohol cravings   sertraline (ZOLOFT) 100 MG tablet Take 1 tabnlet  Daily for Mood                                                               /                                       TAKE                                     BY                                 MOUTH   zinc gluconate 50 MG tablet Take 50 mg by mouth daily.   No current facility-administered medications on file prior to visit.     Allergies:  Allergies  Allergen Reactions   Dilaudid [Hydromorphone Hcl]      Medical History:  Past Medical History:  Diagnosis Date   Acute alcoholic hepatitis 16/09/958   Asthma    Prediabetes    Vitamin D deficiency    Family history- Reviewed and unchanged Social history- Reviewed and unchanged   Review of Systems:  ROS    Physical Exam: There were no vitals taken for this visit. Wt Readings from Last 3 Encounters:  07/06/22 155 lb 8 oz (70.5 kg)  03/16/22 159 lb (72.1 kg)  01/15/22 158 lb 9.6 oz (71.9 kg)   General Appearance: Well  nourished, in no apparent distress. Eyes: PERRLA, EOMs, conjunctiva no swelling or erythema Sinuses: No Frontal/maxillary tenderness ENT/Mouth: Left TM UTA d/t cerumen impaction. Ext aud canals clear, Right TM without erythema, bulging. No erythema, swelling, or exudate on post pharynx.  Tonsils not swollen  or erythematous. Hearing intact. Neck: Supple, thyroid normal.  Respiratory: Respiratory effort normal, BS equal bilaterally without rales, rhonchi, wheezing or stridor.  Cardio: RRR with no MRGs. Brisk peripheral pulses without edema.  Abdomen: Soft, + BS.  Non tender, no guarding, rebound, hernias, masses. Lymphatics: Non tender without lymphadenopathy.  Musculoskeletal: Full ROM, 5/5 strength, Normal gait Skin: Warm, dry without rashes, lesions, ecchymosis.  Neuro: Cranial nerves intact. No cerebellar symptoms.  Psych: Awake and oriented X 3, normal affect, Insight and Judgment appropriate.    Adela Glimpse, NP 8:37 AM [facility]

## 2022-07-21 LAB — COMPLETE METABOLIC PANEL WITH GFR
AG Ratio: 1.9 (calc) (ref 1.0–2.5)
ALT: 12 U/L (ref 9–46)
AST: 13 U/L (ref 10–40)
Albumin: 4.7 g/dL (ref 3.6–5.1)
Alkaline phosphatase (APISO): 63 U/L (ref 36–130)
BUN: 10 mg/dL (ref 7–25)
CO2: 25 mmol/L (ref 20–32)
Calcium: 9.9 mg/dL (ref 8.6–10.3)
Chloride: 104 mmol/L (ref 98–110)
Creat: 0.96 mg/dL (ref 0.60–1.26)
Globulin: 2.5 g/dL (calc) (ref 1.9–3.7)
Glucose, Bld: 70 mg/dL (ref 65–99)
Potassium: 4 mmol/L (ref 3.5–5.3)
Sodium: 141 mmol/L (ref 135–146)
Total Bilirubin: 0.3 mg/dL (ref 0.2–1.2)
Total Protein: 7.2 g/dL (ref 6.1–8.1)
eGFR: 109 mL/min/{1.73_m2} (ref >=60)

## 2022-07-21 LAB — CBC WITH DIFFERENTIAL/PLATELET
Absolute Monocytes: 527 cells/uL (ref 200–950)
Basophils Absolute: 50 cells/uL (ref 0–200)
Basophils Relative: 0.8 %
Eosinophils Absolute: 192 cells/uL (ref 15–500)
Eosinophils Relative: 3.1 %
HCT: 45 % (ref 38.5–50.0)
Hemoglobin: 15 g/dL (ref 13.2–17.1)
Lymphs Abs: 2003 cells/uL (ref 850–3900)
MCH: 29.1 pg (ref 27.0–33.0)
MCHC: 33.3 g/dL (ref 32.0–36.0)
MCV: 87.2 fL (ref 80.0–100.0)
MPV: 11.7 fL (ref 7.5–12.5)
Monocytes Relative: 8.5 %
Neutro Abs: 3429 cells/uL (ref 1500–7800)
Neutrophils Relative %: 55.3 %
Platelets: 260 10*3/uL (ref 140–400)
RBC: 5.16 10*6/uL (ref 4.20–5.80)
RDW: 12.3 % (ref 11.0–15.0)
Total Lymphocyte: 32.3 %
WBC: 6.2 10*3/uL (ref 3.8–10.8)

## 2022-07-21 LAB — LIPID PANEL
Cholesterol: 183 mg/dL (ref <200)
HDL: 47 mg/dL (ref >=40)
LDL Cholesterol (Calc): 110 mg/dL (calc) — ABNORMAL HIGH
Non-HDL Cholesterol (Calc): 136 mg/dL (calc) — ABNORMAL HIGH (ref <130)
Total CHOL/HDL Ratio: 3.9 (calc) (ref <5.0)
Triglycerides: 145 mg/dL (ref <150)

## 2022-07-21 LAB — VITAMIN D 25 HYDROXY (VIT D DEFICIENCY, FRACTURES): Vit D, 25-Hydroxy: 69 ng/mL (ref 30–100)

## 2022-09-01 ENCOUNTER — Ambulatory Visit (INDEPENDENT_AMBULATORY_CARE_PROVIDER_SITE_OTHER): Payer: No Typology Code available for payment source | Admitting: Neurology

## 2022-09-01 DIAGNOSIS — G4720 Circadian rhythm sleep disorder, unspecified type: Secondary | ICD-10-CM

## 2022-09-01 DIAGNOSIS — G4719 Other hypersomnia: Secondary | ICD-10-CM

## 2022-09-01 DIAGNOSIS — G478 Other sleep disorders: Secondary | ICD-10-CM

## 2022-09-01 DIAGNOSIS — R065 Mouth breathing: Secondary | ICD-10-CM

## 2022-09-01 DIAGNOSIS — G4726 Circadian rhythm sleep disorder, shift work type: Secondary | ICD-10-CM

## 2022-09-01 DIAGNOSIS — Z87898 Personal history of other specified conditions: Secondary | ICD-10-CM

## 2022-09-01 DIAGNOSIS — R0683 Snoring: Secondary | ICD-10-CM | POA: Diagnosis not present

## 2022-09-01 DIAGNOSIS — G4763 Sleep related bruxism: Secondary | ICD-10-CM

## 2022-09-08 NOTE — Procedures (Unsigned)
Physician Interpretation:   31 year old male with an underlying medical history of asthma, prediabetes, vitamin D deficiency, depression, anxiety, bruxism, who reports snoring and excessive daytime somnolence, as well as waking up with jerking at night.  He had a home sleep test on 05/19/2022 which did not show any significant sleep disordered breathing.  He presents for reevaluation with an attended sleep study.  He wore his mouthguard during the sleep test.  The study is interpreted on behalf of Dr. Vickey Huger.   EEG: Review of the EEG showed no abnormal electrical discharges and symmetrical bihemispheric findings.    EKG: The EKG revealed normal sinus rhythm (NSR).   AUDIO/VIDEO REVIEW: The audio and video review did not show any abnormal or unusual behaviors, movements, phonations or vocalizations. The patient took 1 restroom break.  Mild snoring was detected.  POST-STUDY QUESTIONNAIRE: Post study, the patient indicated, that sleep was worse than usual.  He reported that he was not as tired (last) night as he usually is before going to sleep.  He commented in the morning questionnaire that the bed and room were very comfortable.   IMPRESSION:   Primary Snoring Dysfunctions associated with sleep stages or arousal from sleep  RECOMMENDATIONS:   This study does not demonstrate any significant obstructive or central sleep disordered breathing with an AHI of less than 5/hour -his total AHI was 1.3/h (utilizing the 4% desaturation rule for hypopneas), O2 nadir 93%.  He had mild snoring.  For disturbing snoring, an oral appliance through dentistry or orthodontics can be considered if desired by the patient. No significant periodic leg movements were noted during this test or increase in REM sleep EMG activity.   This study shows sleep fragmentation and abnormal sleep stage percentages; these are nonspecific findings and per se do not signify an intrinsic sleep disorder or a cause for the patient's  sleep-related symptoms. Causes include (but are not limited to) the first night effect of the sleep study, circadian rhythm disturbances, medication effect or an underlying mood disorder or medical problem.  The patient should be cautioned not to drive, work at heights, or operate dangerous or heavy equipment when tired or sleepy. Review and reiteration of good sleep hygiene measures should be pursued with any patient. The patient will be advised to follow up with the referring provider, who will be notified of the test results.    I certify that I have reviewed the entire raw data recording prior to the issuance of this report in accordance with the Standards of Accreditation of the American Academy of Sleep Medicine (AASM).  Huston Foley, MD, PhD Medical Director, [facility]) Diplomat, ABPN (Neurology and Sleep)   Technical Report:   ***

## 2022-09-09 ENCOUNTER — Telehealth: Payer: Self-pay | Admitting: Neurology

## 2022-09-09 NOTE — Telephone Encounter (Signed)
Called and spoke with pt Relayed results per Dr. Teofilo Pod note. Pt verbalized understanding. He will f/u with his PCP moving forward.

## 2022-09-09 NOTE — Telephone Encounter (Signed)
This patient saw Dr. Vickey Huger for sleep evaluation on 05/19/22.  I read the PSG from 09/01/22 on Dr. Oliva Bustard behalf:  Please call and notify the patient that the recent sleep study did not show any significant obstructive sleep apnea. He achieved all stages of sleep, no abnormal leg movements or excessive grinding (he used his occlusive guard). No need for CPAP therapy. He can follow up with his PCP. If he wishes to make a FU appt with Dr. Vickey Huger, please assist with making an appt.    Thanks,  Huston Foley, MD, PhD [facility]: [facility])

## 2022-10-10 ENCOUNTER — Other Ambulatory Visit: Payer: Self-pay | Admitting: Internal Medicine

## 2022-10-10 DIAGNOSIS — F1020 Alcohol dependence, uncomplicated: Secondary | ICD-10-CM

## 2022-12-07 ENCOUNTER — Other Ambulatory Visit: Payer: Self-pay | Admitting: Neurology

## 2023-01-05 ENCOUNTER — Ambulatory Visit: Payer: Self-pay | Admitting: Neurology

## 2023-01-18 ENCOUNTER — Encounter: Payer: Self-pay | Admitting: Internal Medicine

## 2023-01-18 NOTE — Patient Instructions (Signed)
Due to recent changes in healthcare laws, you may see the results of your imaging and laboratory studies on MyChart before your provider has had a chance to review them.  We understand that in some cases there may be results that are confusing or concerning to you. Not all laboratory results come back in the same time frame and the provider may be waiting for multiple results in order to interpret others.  Please give us 48 hours in order for your provider to thoroughly review all the results before contacting the office for clarification of your results.   +++++++++++++++++++++++++++++++++  Vit D  & Vit C 1,000 mg   are recommended to help protect  against the Covid-19 and other Corona viruses.    Also it's recommended  to take  Zinc 50 mg  to help  protect against the Covid-19   and best place to get  is also on [employer]  and don't pay more than 6-8 cents /pill !  ================================= Coronavirus (COVID-19) Are you at risk?  Are you at risk for the Coronavirus (COVID-19)?  To be considered HIGH RISK for Coronavirus (COVID-19), you have to meet the following criteria:  Traveled to [place], [place], [place], [place] or [place]; or in the [place] to [place], [place], [place]  or [place]; and have fever, cough, and shortness of breath within the last 2 weeks of travel OR Been in close contact with a person diagnosed with COVID-19 within the last 2 weeks and have  fever, cough,and shortness of breath  IF YOU DO NOT MEET THESE CRITERIA, YOU ARE CONSIDERED LOW RISK FOR COVID-19.  What to do if you are HIGH RISK for COVID-19?  If you are having a medical emergency, call 911. Seek medical care right away. Before you go to a doctor's office, urgent care or emergency department,  call ahead and tell them about your recent travel, contact with someone diagnosed with COVID-19   and your symptoms.  You should receive instructions from your physician's office  regarding next steps of care.  When you arrive at healthcare provider, tell the healthcare staff immediately you have returned from  visiting [place], [place], [place], [place] or [place]; or traveled in the [place] to [place], [place],  [place] or [place] in the last two weeks or you have been in close contact with a person diagnosed with  COVID-19 in the last 2 weeks.   Tell the health care staff about your symptoms: fever, cough and shortness of breath. After you have been seen by a medical provider, you will be either: Tested for (COVID-19) and discharged home on quarantine except to seek medical care if  symptoms worsen, and asked to  Stay home and avoid contact with others until you get your results (4-5 days)  Avoid travel on public transportation if possible (such as bus, train, or airplane) or Sent to the Emergency Department by EMS for evaluation, COVID-19 testing  and  possible admission depending on your condition and test results.  What to do if you are LOW RISK for COVID-19?  Reduce your risk of any infection by using the same precautions used for avoiding the common cold or flu:  Wash your hands often with soap and warm water for at least 20 seconds.  If soap and water are not readily available,  use an alcohol-based hand sanitizer with at least 60% alcohol.  If coughing or sneezing, cover your mouth and nose by coughing   or sneezing into the elbow areas of your shirt or coat,  into a tissue or into your sleeve (not your hands). Avoid shaking hands with others and consider head nods or verbal greetings only. Avoid touching your eyes, nose, or mouth with unwashed hands.  Avoid close contact with people who are sick. Avoid places or events with large numbers of people in one location, like concerts or sporting events. Carefully consider travel plans you have or are making. If you are planning any travel outside or inside the [place], visit the CDC's Travelers' Health  webpage for the latest health notices. If you have some symptoms but not all symptoms, continue to monitor at home and seek medical attention  if your symptoms worsen. If you are having a medical emergency, call 911. >>>>>>>>>>>>>>>>>>>>>>>> Preventive Care for Adults  A healthy lifestyle and preventive care can promote health and wellness. Preventive health guidelines for men include the following key practices: A routine yearly physical is a good way to check with your health care provider about your health and preventative screening. It is a chance to share any concerns and updates on your health and to receive a thorough exam. Visit your dentist for a routine exam and preventative care every 6 months. Brush your teeth twice a day and floss once a day. Good oral hygiene prevents tooth decay and gum disease. The frequency of eye exams is based on your age, health, family medical history, use of contact lenses, and other factors. Follow your health care provider's recommendations for frequency of eye exams. Eat a healthy diet. Foods such as vegetables, fruits, whole grains, low-fat dairy products, and lean protein foods contain the nutrients you need without too many calories. Decrease your intake of foods high in solid fats, added sugars, and salt. Eat the right amount of calories for you. Get information about a proper diet from your health care provider, if necessary. Regular physical exercise is one of the most important things you can do for your health. Most adults should get at least 150 minutes of moderate-intensity exercise (any activity that increases your heart rate and causes you to sweat) each week. In addition, most adults need muscle-strengthening exercises on 2 or more days a week. Maintain a healthy weight. The body mass index (BMI) is a screening tool to identify possible weight problems. It provides an estimate of body fat based on height and weight. Your health care provider can  find your BMI and can help you achieve or maintain a healthy weight. For adults 20 years and older: A BMI below 18.5 is considered underweight. A BMI of 18.5 to 24.9 is normal. A BMI of 25 to 29.9 is considered overweight. A BMI of 30 and above is considered obese. Maintain normal blood lipids and cholesterol levels by exercising and minimizing your intake of saturated fat. Eat a balanced diet with plenty of fruit and vegetables. Blood tests for lipids and cholesterol should begin at age 20 and be repeated every 5 years. If your lipid or cholesterol levels are high, you are over 50, or you are at high risk for heart disease, you may need your cholesterol levels checked more frequently. Ongoing high lipid and cholesterol levels should be treated with medicines if diet and exercise are not working. If you smoke, find out from your health care provider how to quit. If you do not use tobacco, do not start. Lung cancer screening is recommended for adults aged 55-80 years who are at high risk for   developing lung cancer because of a history of smoking. A yearly low-dose CT scan of the lungs is recommended for people who have at least a 30-pack-year history of smoking and are a current smoker or have quit within the past 15 years. A pack year of smoking is smoking an average of 1 pack of cigarettes a day for 1 year (for example: 1 pack a day for 30 years or 2 packs a day for 15 years). Yearly screening should continue until the smoker has stopped smoking for at least 15 years. Yearly screening should be stopped for people who develop a health problem that would prevent them from having lung cancer treatment. If you choose to drink alcohol, do not have more than 2 drinks per day. One drink is considered to be 12 ounces (355 mL) of beer, 5 ounces (148 mL) of wine, or 1.5 ounces (44 mL) of liquor. High blood pressure causes heart disease and increases the risk of stroke. Your blood pressure should be checked. Ongoing  high blood pressure should be treated with medicines, if weight loss and exercise are not effective. If you are 45-79 years old, ask your health care provider if you should take aspirin to prevent heart disease. Diabetes screening involves taking a blood sample to check your fasting blood sugar level. Testing should be considered at a younger age or be carried out more frequently if you are overweight and have at least 1 risk factor for diabetes. Colorectal cancer can be detected and often prevented. Most routine colorectal cancer screening begins at the age of 50 and continues through age 75. However, your health care provider may recommend screening at an earlier age if you have risk factors for colon cancer. On a yearly basis, your health care provider may provide home test kits to check for hidden blood in the stool. Use of a small camera at the end of a tube to directly examine the colon (sigmoidoscopy or colonoscopy) can detect the earliest forms of colorectal cancer. Talk to your health care provider about this at age 50, when routine screening begins. Direct exam of the colon should be repeated every 5-10 years through age 75, unless early forms of precancerous polyps or small growths are found. Screening for abdominal aortic aneurysm (AAA)  are recommended for persons over age 50 who have history of hypertensionor who are current or former smokers. Talk with your health care provider about prostate cancer screening. Testicular cancer screening is recommended for adult males. Screening includes self-exam, a health care provider exam, and other screening tests. Consult with your health care provider about any symptoms you have or any concerns you have about testicular cancer. Use sunscreen. Apply sunscreen liberally and repeatedly throughout the day. You should seek shade when your shadow is shorter than you. Protect yourself by wearing long sleeves, pants, a wide-brimmed hat, and sunglasses year  round, whenever you are outdoors. Once a month, do a whole-body skin exam, using a mirror to look at the skin on your back. Tell your health care provider about new moles, moles that have irregular borders, moles that are larger than a pencil eraser, or moles that have changed in shape or color. Stay current with required vaccines (immunizations). Influenza vaccine. All adults should be immunized every year. Tetanus, diphtheria, and acellular pertussis (Td, Tdap) vaccine. An adult who has not previously received Tdap or who does not know his vaccine status should receive 1 dose of Tdap. This initial dose should be followed by tetanus   and diphtheria toxoids (Td) booster doses every 10 years. Adults with an unknown or incomplete history of completing a 3-dose immunization series with Td-containing vaccines should begin or complete a primary immunization series including a Tdap dose. Adults should receive a Td booster every 10 years. Zoster vaccine. One dose is recommended for adults aged 60 years or older unless certain conditions are present.  Pneumococcal 13-valent conjugate (PCV13) vaccine. When indicated, a person who is uncertain of his immunization history and has no record of immunization should receive the PCV13 vaccine. An adult aged 19 years or older who has certain medical conditions and has not been previously immunized should receive 1 dose of PCV13 vaccine. This PCV13 should be followed with a dose of pneumococcal polysaccharide (PPSV23) vaccine. The PPSV23 vaccine dose should be obtained at least 8 weeks after the dose of PCV13 vaccine. An adult aged 19 years or older who has certain medical conditions and previously received 1 or more doses of PPSV23 vaccine should receive 1 dose of PCV13. The PCV13 vaccine dose should be obtained 1 or more years after the last PPSV23 vaccine dose.  Pneumococcal polysaccharide (PPSV23) vaccine. When PCV13 is also indicated, PCV13 should be obtained first. All  adults aged 65 years and older should be immunized. An adult younger than age 65 years who has certain medical conditions should be immunized. Any person who resides in a nursing home or long-term care facility should be immunized. An adult smoker should be immunized. People with an immunocompromised condition and certain other conditions should receive both PCV13 and PPSV23 vaccines. People with human immunodeficiency virus (HIV) infection should be immunized as soon as possible after diagnosis. Immunization during chemotherapy or radiation therapy should be avoided. Routine use of PPSV23 vaccine is not recommended for American Indians, [place] Natives, or people younger than 65 years unless there are medical conditions that require PPSV23 vaccine. When indicated, people who have unknown immunization and have no record of immunization should receive PPSV23 vaccine. One-time revaccination 5 years after the first dose of PPSV23 is recommended for people aged 19-64 years who have chronic kidney failure, nephrotic syndrome, asplenia, or immunocompromised conditions. People who received 1-2 doses of PPSV23 before age 65 years should receive another dose of PPSV23 vaccine at age 65 years or later if at least 5 years have passed since the previous dose. Doses of PPSV23 are not needed for people immunized with PPSV23 at or after age 65 years. Hepatitis A vaccine. Adults who wish to be protected from this disease, have certain high-risk conditions, work with hepatitis A-infected animals, work in hepatitis A research labs, or travel to or work in countries with a high rate of hepatitis A should be immunized. Adults who were previously unvaccinated and who anticipate close contact with an international adoptee during the first 60 days after arrival in the [place] from a country with a high rate of hepatitis A should be immunized. Hepatitis B vaccine. Adults should be immunized if they wish to be protected from this  disease, have certain high-risk conditions, may be exposed to blood or other infectious body fluids, are household contacts or sex partners of hepatitis B positive people, are clients or workers in certain care facilities, or travel to or work in countries with a high rate of hepatitis B.  Preventive Service / Frequency  Ages 19 to 39 Blood pressure check. Lipid and cholesterol check. Hepatitis C blood test.** / For any individual with known risks for hepatitis C. Skin self-exam. /   Monthly. Influenza vaccine. / Every year. Tetanus, diphtheria, and acellular pertussis (Tdap, Td) vaccine.** / Consult your health care provider. 1 dose of Td every 10 years. HPV vaccine. / 3 doses over 6 months, if 26 or younger. Measles, mumps, rubella (MMR) vaccine.** / You need at least 1 dose of MMR if you were born in 1957 or later. You may also need a second dose. Pneumococcal 13-valent conjugate (PCV13) vaccine.** / Consult your health care provider. Pneumococcal polysaccharide (PPSV23) vaccine.** / 1 to 2 doses if you smoke cigarettes or if you have certain conditions. Meningococcal vaccine.** / 1 dose if you are age 19 to 21 years and a first-year college student living in a residence hall, or have one of several medical conditions. You may also need additional booster doses. Hepatitis A vaccine.** / Consult your health care provider. Hepatitis B vaccine.** / Consult your health care provider. +++++++++ Recommend Adult Low Dose Aspirin or  coated  Aspirin 81 mg daily  To reduce risk of Colon Cancer 40 %,  Skin Cancer 26 % ,  Melanoma 46%  and  Pancreatic cancer 60% ++++++++++++++++++ Vitamin D goal  is between 70-100.  Please make sure that you are taking your Vitamin D as directed.  It is very important as a natural anti-inflammatory  helping hair, skin, and nails, as well as reducing stroke and heart attack risk.  It helps your bones and helps with mood. It also decreases numerous cancer risks  so please take it as directed.  Low Vit D is associated with a 200-300% higher risk for CANCER  and 200-300% higher risk for HEART   ATTACK  &  STROKE.   ...................................... It is also associated with higher death rate at younger ages,  autoimmune diseases like Rheumatoid arthritis, Lupus, Multiple Sclerosis.    Also many other serious conditions, like depression, Alzheimer's Dementia, infertility, muscle aches, fatigue, fibromyalgia - just to name a few. +++++++++++++++++++++ Recommend the book "The END of DIETING" by Dr Joel Fuhrman  & the book "The END of DIABETES " by Dr Joel Fuhrman At [employer] - get book & Audio CD's    Being diabetic has a  300% increased risk for heart attack, stroke, cancer, and alzheimer- type vascular dementia. It is very important that you work harder with diet by avoiding all foods that are white. Avoid white rice (brown & wild rice is OK), white potatoes (sweetpotatoes in moderation is OK), White bread or wheat bread or anything made out of white flour like bagels, donuts, rolls, buns, biscuits, cakes, pastries, cookies, pizza crust, and pasta (made from white flour & egg whites) - vegetarian pasta or spinach or wheat pasta is OK. Multigrain breads like Arnold's or Pepper[employer], or multigrain sandwich thins or flatbreads.  Diet, exercise and weight loss can reverse and cure diabetes in the early stages.  Diet, exercise and weight loss is very important in the control and prevention of complications of diabetes which affects every system in your body, ie. Brain - dementia/stroke, eyes - glaucoma/blindness, heart - heart attack/heart failure, kidneys - dialysis, stomach - gastric paralysis, intestines - malabsorption, nerves - severe painful neuritis, circulation - gangrene & loss of a leg(s), and finally cancer and Alzheimers.    I recommend avoid fried & greasy foods,  sweets/candy, white rice (brown or wild rice or Quinoa is OK), white potatoes  (sweet potatoes are OK) - anything made from white flour - bagels, doughnuts, rolls, buns, biscuits,white and wheat breads, pizza crust and   traditional pasta made of white flour & egg white(vegetarian pasta or spinach or wheat pasta is OK).  Multi-grain bread is OK - like multi-grain flat bread or sandwich thins. Avoid alcohol in excess. Exercise is also important.    Eat all the vegetables you want - avoid meat, especially red meat and dairy - especially cheese.  Cheese is the most concentrated form of trans-fats which is the worst thing to clog up our arteries. Veggie cheese is OK which can be found in the fresh produce section at [employer] or [employer] or [employer]  +++++++++++++++++++ DASH Eating Plan  DASH stands for "Dietary Approaches to Stop Hypertension."   The DASH eating plan is a healthy eating plan that has been shown to reduce high blood pressure (hypertension). Additional health benefits may include reducing the risk of type 2 diabetes mellitus, heart disease, and stroke. The DASH eating plan may also help with weight loss. WHAT DO I NEED TO KNOW ABOUT THE DASH EATING PLAN? For the DASH eating plan, you will follow these general guidelines: Choose foods with a percent daily value for sodium of less than 5% (as listed on the food label). Use salt-free seasonings or herbs instead of table salt or sea salt. Check with your health care provider or pharmacist before using salt substitutes. Eat lower-sodium products, often labeled as "lower sodium" or "no salt added." Eat fresh foods. Eat more vegetables, fruits, and low-fat dairy products. Choose whole grains. Look for the word "whole" as the first word in the ingredient list. Choose fish  Limit sweets, desserts, sugars, and sugary drinks. Choose heart-healthy fats. Eat veggie cheese  Eat more home-cooked food and less restaurant, buffet, and fast food. Limit fried foods. Cook foods using methods other than frying. Limit  canned vegetables. If you do use them, rinse them well to decrease the sodium. When eating at a restaurant, ask that your food be prepared with less salt, or no salt if possible.                      WHAT FOODS CAN I EAT? Read Dr Joel Fuhrman's books on The End of Dieting & The End of Diabetes  Grains Whole grain or whole wheat bread. Brown rice. Whole grain or whole wheat pasta. Quinoa, bulgur, and whole grain cereals. Low-sodium cereals. Corn or whole wheat flour tortillas. Whole grain cornbread. Whole grain crackers. Low-sodium crackers.  Vegetables Fresh or frozen vegetables (raw, steamed, roasted, or grilled). Low-sodium or reduced-sodium tomato and vegetable juices. Low-sodium or reduced-sodium tomato sauce and paste. Low-sodium or reduced-sodium canned vegetables.   Fruits All fresh, canned (in natural juice), or frozen fruits.  Protein Products  All fish and seafood.  Dried beans, peas, or lentils. Unsalted nuts and seeds. Unsalted canned beans.  Dairy Low-fat dairy products, such as skim or 1% milk, 2% or reduced-fat cheeses, low-fat ricotta or cottage cheese, or plain low-fat yogurt. Low-sodium or reduced-sodium cheeses.  Fats and Oils Tub margarines without trans fats. Light or reduced-fat mayonnaise and salad dressings (reduced sodium). Avocado. Safflower, olive, or canola oils. Natural peanut or almond butter.  Other Unsalted popcorn and pretzels. The items listed above may not be a complete list of recommended foods or beverages. Contact your dietitian for more options.  +++++++++++++++++++  WHAT FOODS ARE NOT RECOMMENDED? Grains/ White flour or wheat flour White bread. White pasta. White rice. Refined cornbread. Bagels and croissants. Crackers that contain trans fat.  Vegetables  Creamed or fried vegetables. Vegetables   in a . Regular canned vegetables. Regular canned tomato sauce and paste. Regular tomato and vegetable juices.  Fruits Dried fruits. Canned fruit  in light or heavy syrup. Fruit juice.  Meat and Other Protein Products Meat in general - RED meat & White meat.  Fatty cuts of meat. Ribs, chicken wings, all processed meats as bacon, sausage, bologna, salami, fatback, hot dogs, bratwurst and packaged luncheon meats.  Dairy Whole or 2% milk, cream, half-and-half, and cream cheese. Whole-fat or sweetened yogurt. Full-fat cheeses or blue cheese. Non-dairy creamers and whipped toppings. Processed cheese, cheese spreads, or cheese curds.  Condiments Onion and garlic salt, seasoned salt, table salt, and sea salt. Canned and packaged gravies. Worcestershire sauce. Tartar sauce. Barbecue sauce. Teriyaki sauce. Soy sauce, including reduced sodium. Steak sauce. Fish sauce. Oyster sauce. Cocktail sauce. Horseradish. Ketchup and mustard. Meat flavorings and tenderizers. Bouillon cubes. Hot sauce. Tabasco sauce. Marinades. Taco seasonings. Relishes.  Fats and Oils Butter, stick margarine, lard, shortening and bacon fat. Coconut, palm kernel, or palm oils. Regular salad dressings.  Pickles and olives. Salted popcorn and pretzels.  The items listed above may not be a complete list of foods and beverages to avoid.   

## 2023-01-18 NOTE — Progress Notes (Unsigned)
Annual  Screening/Preventative Visit  & Comprehensive Evaluation & Examination   Future Appointments  Date Time Provider Department  01/19/2023 10:00 AM Lucky Cowboy, MD [facility]  01/25/2024 11:00 AM Lucky Cowboy, MD [facility]              This very nice 32 y.o. single WM presents for a Screening /Preventative Visit & comprehensive evaluation and management of multiple medical co-morbidities.  Patient has been followed for labile HTN, HLD, Prediabetes and Vitamin D Deficiency. Patient had Sleep evaluation for non restorative sleep and Studies found No OSA.  He reports poor sleep hygiene with restless sleep. Reports very vivid of  dreaming. Sleeping better & reports more alert during day with  Modafinil.                                                      Patient has been followed expectantly for labile HTN since 2019.  Patient's BP has been controlled and today's BP is at goal - 128/80.   Patient denies any cardiac symptoms as chest pain, palpitations, shortness of breath, dizziness or ankle swelling.       Patient also has hx/o polysubstance abuse with Alcohol &  Opioids  with  completion of a rehab program in 2017 and attends AA and has remained drug & alcohol free since Nov 2017.         Patient's hyperlipidemia is not controlled with diet. Last lipids were  not at goal :  Lab Results  Component Value Date   CHOL 183 07/20/2022   HDL 47 07/20/2022   LDLCALC 110 (H) 07/20/2022   TRIG 145 07/20/2022   CHOLHDL 3.9 07/20/2022          Patient has hx/o prediabetes (A1c 5.7% /2013) and patient denies reactive hypoglycemic symptoms, visual blurring, diabetic polys or paresthesias. Last A1c was normal & at goal :   Lab Results  Component Value Date   HGBA1C 5.3 01/15/2022         Finally, patient has history of Vitamin D Deficiency ("23" /2012)  and last vitamin D was at goal :   Lab Results  Component Value Date   VD25OH 69 07/20/2022        Current  Outpatient Medications on File Prior to Visit  Medication Sig   VITAMIN C  Take 1 tablet  daily.   aspirin EC 81 MG tablet Take daily   VITAMIN D 5000 units CAPS Takes 1 capsule daily   VITAMIN B-12 tab Take 1 tablet    naltrexone (DEPADE) 50 MG tablet take 1 tablet daily    sertraline  100 MG tablet TAKE 1 TABLET  DAILY    zinc 50 MG tablet Take  daily.    Allergies  Allergen Reactions   Dilaudid [Hydromorphone Hcl]      Past Medical History:  Diagnosis Date   Acute alcoholic hepatitis 07/29/2016   Asthma    Prediabetes    Vitamin D deficiency      Health Maintenance  Topic Date Due   COVID-19 Vaccine (4 - Booster for Pfizer series) 09/04/2020   INFLUENZA VACCINE  04/21/2022   TETANUS/TDAP  08/21/2022   Hepatitis C Screening  Completed   HIV Screening  Completed   HPV VACCINES  Aged [employer] History  Administered Date(s) Administered   Influenza,inj,Quad  08/09/2018   Influenza,inj,quad 08/06/2016   Influenza 08/12/2020   PFIZER SARS-COV-2 Vacc 12/07/2019, 01/01/2020, 07/10/2020   PPD Test 11/02/2018, 01/08/2020, 01/13/2021   Td 08/21/2012   Td (Adult) 09/20/2012    Past Surgical History:  Procedure Laterality Date   ADENOIDECTOMY     CLOSED REDUCTION FOREARM FRACTURE Left 2005   INGUINAL HERNIA REPAIR  2003   TONSILLECTOMY       Family History  Problem Relation Age of Onset   Heart disease Mother    Heart attack Other    Arthritis Other    Hypertension Other    Diabetes Other    Stroke Other    Alcohol abuse Paternal Grandfather      Social  Marital status: Single       Spouse  None                    children: None  Occupational History   Not on file  History   Tobacco Use   Smoking status: Former    Types: Cigarettes    Quit date: 07/26/2016    Years since quitting: 5.4   Smokeless tobacco: Never  Substance Use Topics   Alcohol use: No    Comment: 51mo sober   Drug use: No         Sexual activity: Active monogamous  heterosexual  relationship       ROS Constitutional: Denies fever, chills, weight loss/gain, headaches, insomnia,  night sweats or change in appetite. Does c/o fatigue. Eyes: Denies redness, blurred vision, diplopia, discharge, itchy or watery eyes.  ENT: Denies discharge, congestion, post nasal drip, epistaxis, sore throat, earache, hearing loss, dental pain, Tinnitus, Vertigo, Sinus pain or snoring.  Cardio: Denies chest pain, palpitations, irregular heartbeat, syncope, dyspnea, diaphoresis, orthopnea, PND, claudication or edema Respiratory: denies cough, dyspnea, DOE, pleurisy, hoarseness, laryngitis or wheezing.  Gastrointestinal: Denies dysphagia, heartburn, reflux, water brash, pain, cramps, nausea, vomiting, bloating, diarrhea, constipation, hematemesis, melena, hematochezia, jaundice or hemorrhoids Genitourinary: Denies dysuria, frequency, urgency, nocturia, hesitancy, discharge, hematuria or flank pain Musculoskeletal: Denies arthralgia, myalgia, stiffness, Jt. Swelling, pain, limp or strain/sprain. Denies Falls. Skin: Denies puritis, rash, hives, warts, acne, eczema or change in skin lesion Neuro: No weakness, tremor, incoordination, spasms, paresthesia or pain Psychiatric: Denies confusion, memory loss or sensory loss. Denies Depression. Endocrine: Denies change in weight, skin, hair change, nocturia, and paresthesia, diabetic polys, visual blurring or hyper / hypo glycemic episodes.  Heme/Lymph: No excessive bleeding, bruising or enlarged lymph nodes.   Physical Exam  There were no vitals taken for this visit.  General Appearance: Well nourished and well groomed and in no apparent distress.  Eyes: PERRLA, EOMs, conjunctiva no swelling or erythema, normal fundi and vessels. Sinuses: No frontal/maxillary tenderness ENT/Mouth: EACs patent / TMs  nl. Nares clear without erythema, swelling, mucoid exudates. Oral hygiene is good. No erythema, swelling, or exudate. Tongue normal,  non-obstructing. Tonsils not swollen or erythematous. Hearing normal.  Neck: Supple, thyroid not palpable. No bruits, nodes or JVD. Respiratory: Respiratory effort normal.  BS equal and clear bilateral without rales, rhonci, wheezing or stridor. Cardio: Heart sounds are normal with regular rate and rhythm and no murmurs, rubs or gallops. Peripheral pulses are normal and equal bilaterally without edema. No aortic or femoral bruits. Chest: symmetric with normal excursions and percussion.  Abdomen: Soft, with Nl bowel sounds. Nontender, no guarding, rebound, hernias, masses, or organomegaly.  Lymphatics: Non tender without lymphadenopathy.  Musculoskeletal:  Full ROM all peripheral extremities, joint stability, 5/5 strength, and normal gait.                               Tender over Rt inferior patellar tendon.  Skin: Warm and dry without rashes, lesions, cyanosis, clubbing or  ecchymosis.  Neuro: Cranial nerves intact, reflexes equal bilaterally. Normal muscle tone, no cerebellar symptoms. Sensation intact.  Pysch: Alert and oriented X 3 with normal affect, insight and judgment appropriate.   Assessment and Plan  1. Annual Preventative/Screening Exam    2. Labile hypertension  - EKG 12-Lead - Urinalysis, Routine w reflex microscopic - Microalbumin / creatinine urine ratio - CBC with Differential/Platelet - COMPLETE METABOLIC PANEL WITH GFR - Magnesium - Lipid panel - TSH  3. Hyperlipidemia, mixed  - EKG 12-Lead  4. Abnormal glucose  - EKG 12-Lead - Hemoglobin A1c - Insulin, random  5. Vitamin D deficiency  - VITAMIN D 25 Hydroxy   6. Prediabetes  - Hemoglobin A1c - Insulin, random  7. Screening-pulmonary TB  - TB Skin Test  8. Screening for colorectal cancer  - POC Hemoccult Bld/Stl   9. Screening for heart disease  - EKG 12-Lead  10. FHx: heart disease  - EKG 12-Lead  11. Fatigue, unspecified type  - Vitamin B12 - Testosterone - CBC with  Differential/Platelet - TSH - Iron, Total/Total Iron Binding Cap  12. Medication management  - Urinalysis, Routine w reflex microscopic - Microalbumin / creatinine urine ratio - CBC with Differential/Platelet - COMPLETE METABOLIC PANEL WITH GFR - Magnesium - Lipid panel - TSH - Hemoglobin A1c - Insulin, random - VITAMIN D 25 Hydroxy           Patient was counseled in prudent diet, weight control to achieve/maintain BMI less than 25, BP monitoring, regular exercise and medications as discussed.  Discussed med effects and SE's. Routine screening labs and tests as requested with regular follow-up as recommended. Over 40 minutes of exam, counseling, chart review and high complex critical decision making was performed   Marinus Maw, MD

## 2023-01-19 ENCOUNTER — Ambulatory Visit (INDEPENDENT_AMBULATORY_CARE_PROVIDER_SITE_OTHER): Payer: Managed Care, Other (non HMO) | Admitting: Internal Medicine

## 2023-01-19 ENCOUNTER — Encounter: Payer: Self-pay | Admitting: Internal Medicine

## 2023-01-19 VITALS — BP 120/80 | HR 69 | Temp 97.6°F | Resp 16 | Ht 70.5 in | Wt 165.2 lb

## 2023-01-19 DIAGNOSIS — Z0000 Encounter for general adult medical examination without abnormal findings: Secondary | ICD-10-CM

## 2023-01-19 DIAGNOSIS — Z136 Encounter for screening for cardiovascular disorders: Secondary | ICD-10-CM

## 2023-01-19 DIAGNOSIS — Z13220 Encounter for screening for lipoid disorders: Secondary | ICD-10-CM | POA: Diagnosis not present

## 2023-01-19 DIAGNOSIS — Z1329 Encounter for screening for other suspected endocrine disorder: Secondary | ICD-10-CM

## 2023-01-19 DIAGNOSIS — Z0001 Encounter for general adult medical examination with abnormal findings: Secondary | ICD-10-CM

## 2023-01-19 DIAGNOSIS — Z8249 Family history of ischemic heart disease and other diseases of the circulatory system: Secondary | ICD-10-CM

## 2023-01-19 DIAGNOSIS — R0989 Other specified symptoms and signs involving the circulatory and respiratory systems: Secondary | ICD-10-CM | POA: Diagnosis not present

## 2023-01-19 DIAGNOSIS — E782 Mixed hyperlipidemia: Secondary | ICD-10-CM

## 2023-01-19 DIAGNOSIS — E559 Vitamin D deficiency, unspecified: Secondary | ICD-10-CM

## 2023-01-19 DIAGNOSIS — Z130 Encounter for screening for diseases of the blood and blood-forming organs and certain disorders involving the immune mechanism: Secondary | ICD-10-CM | POA: Diagnosis not present

## 2023-01-19 DIAGNOSIS — R5383 Other fatigue: Secondary | ICD-10-CM

## 2023-01-19 DIAGNOSIS — Z111 Encounter for screening for respiratory tuberculosis: Secondary | ICD-10-CM | POA: Diagnosis not present

## 2023-01-19 DIAGNOSIS — R7303 Prediabetes: Secondary | ICD-10-CM

## 2023-01-19 DIAGNOSIS — Z131 Encounter for screening for diabetes mellitus: Secondary | ICD-10-CM

## 2023-01-19 DIAGNOSIS — Z1389 Encounter for screening for other disorder: Secondary | ICD-10-CM | POA: Diagnosis not present

## 2023-01-19 DIAGNOSIS — Z79899 Other long term (current) drug therapy: Secondary | ICD-10-CM

## 2023-01-19 DIAGNOSIS — Z1211 Encounter for screening for malignant neoplasm of colon: Secondary | ICD-10-CM

## 2023-01-19 DIAGNOSIS — R7309 Other abnormal glucose: Secondary | ICD-10-CM

## 2023-01-20 LAB — CBC WITH DIFFERENTIAL/PLATELET
Absolute Monocytes: 518 cells/uL (ref 200–950)
Basophils Absolute: 50 cells/uL (ref 0–200)
Basophils Relative: 0.7 %
Eosinophils Absolute: 382 cells/uL (ref 15–500)
Eosinophils Relative: 5.3 %
HCT: 42.9 % (ref 38.5–50.0)
Hemoglobin: 14.2 g/dL (ref 13.2–17.1)
Lymphs Abs: 2052 cells/uL (ref 850–3900)
MCH: 28.9 pg (ref 27.0–33.0)
MCHC: 33.1 g/dL (ref 32.0–36.0)
MCV: 87.4 fL (ref 80.0–100.0)
MPV: 11.4 fL (ref 7.5–12.5)
Monocytes Relative: 7.2 %
Neutro Abs: 4198 cells/uL (ref 1500–7800)
Neutrophils Relative %: 58.3 %
Platelets: 252 10*3/uL (ref 140–400)
RBC: 4.91 10*6/uL (ref 4.20–5.80)
RDW: 12.7 % (ref 11.0–15.0)
Total Lymphocyte: 28.5 %
WBC: 7.2 10*3/uL (ref 3.8–10.8)

## 2023-01-20 LAB — URINALYSIS, ROUTINE W REFLEX MICROSCOPIC
Bilirubin Urine: NEGATIVE
Glucose, UA: NEGATIVE
Hgb urine dipstick: NEGATIVE
Ketones, ur: NEGATIVE
Leukocytes,Ua: NEGATIVE
Nitrite: NEGATIVE
Protein, ur: NEGATIVE
Specific Gravity, Urine: 1.014 (ref 1.001–1.035)
pH: 5.5 (ref 5.0–8.0)

## 2023-01-20 LAB — IRON, TOTAL/TOTAL IRON BINDING CAP
%SAT: 23 % (calc) (ref 20–48)
Iron: 77 ug/dL (ref 50–180)
TIBC: 331 mcg/dL (calc) (ref 250–425)

## 2023-01-20 LAB — LIPID PANEL
Cholesterol: 188 mg/dL (ref <200)
HDL: 41 mg/dL (ref >=40)
LDL Cholesterol (Calc): 96 mg/dL (calc)
Non-HDL Cholesterol (Calc): 147 mg/dL (calc) — ABNORMAL HIGH (ref <130)
Total CHOL/HDL Ratio: 4.6 (calc) (ref <5.0)
Triglycerides: 380 mg/dL — ABNORMAL HIGH (ref <150)

## 2023-01-20 LAB — COMPLETE METABOLIC PANEL WITH GFR
AG Ratio: 1.9 (calc) (ref 1.0–2.5)
ALT: 14 U/L (ref 9–46)
AST: 14 U/L (ref 10–40)
Albumin: 4.3 g/dL (ref 3.6–5.1)
Alkaline phosphatase (APISO): 75 U/L (ref 36–130)
BUN: 13 mg/dL (ref 7–25)
CO2: 29 mmol/L (ref 20–32)
Calcium: 9.5 mg/dL (ref 8.6–10.3)
Chloride: 103 mmol/L (ref 98–110)
Creat: 0.82 mg/dL (ref 0.60–1.26)
Globulin: 2.3 g/dL (calc) (ref 1.9–3.7)
Glucose, Bld: 71 mg/dL (ref 65–99)
Potassium: 4.2 mmol/L (ref 3.5–5.3)
Sodium: 139 mmol/L (ref 135–146)
Total Bilirubin: 0.2 mg/dL (ref 0.2–1.2)
Total Protein: 6.6 g/dL (ref 6.1–8.1)
eGFR: 120 mL/min/{1.73_m2} (ref >=60)

## 2023-01-20 LAB — TESTOSTERONE: Testosterone: 621 ng/dL (ref 250–827)

## 2023-01-20 LAB — HEMOGLOBIN A1C
Hgb A1c MFr Bld: 5.4 % of total Hgb (ref <5.7)
Mean Plasma Glucose: 108 mg/dL
eAG (mmol/L): 6 mmol/L

## 2023-01-20 LAB — MAGNESIUM: Magnesium: 2 mg/dL (ref 1.5–2.5)

## 2023-01-20 LAB — INSULIN, RANDOM: Insulin: 6.9 u[IU]/mL

## 2023-01-20 LAB — VITAMIN B12: Vitamin B-12: 525 pg/mL (ref 200–1100)

## 2023-01-20 LAB — VITAMIN D 25 HYDROXY (VIT D DEFICIENCY, FRACTURES): Vit D, 25-Hydroxy: 54 ng/mL (ref 30–100)

## 2023-01-20 LAB — MICROALBUMIN / CREATININE URINE RATIO
Creatinine, Urine: 82 mg/dL (ref 20–320)
Microalb, Ur: <0.2 mg/dL

## 2023-01-20 LAB — TSH: TSH: 1.74 mIU/L (ref 0.40–4.50)

## 2023-01-20 NOTE — Progress Notes (Signed)
^<^<^<^<^<^<^<^<^<^<^<^<^<^<^<^<^<^<^<^<^<^<^<^<^<^<^<^<^<^<^<^<^<^<^<^<^ ^>^>^>^>^>^>^>^>^>^>^>>^>^>^>^>^>^>^>^>^>^>^>^>^>^>^>^>^>^>^>^>^>^>^>^>^>  -  Test results slightly outside the reference range are not unusual. If there is anything important, I will review this with you,  otherwise it is considered normal test values.  If you have further questions,  please do not hesitate to contact me at the office or via My Chart.   ^<^<^<^<^<^<^<^<^<^<^<^<^<^<^<^<^<^<^<^<^<^<^<^<^<^<^<^<^<^<^<^<^<^<^<^<^ ^>^>^>^>^>^>^>^>^>^>^>^>^>^>^>^>^>^>^>^>^>^>^>^>^>^>^>^>^>^>^>^>^>^>^>^>^  -  Chol = 188 -  Excellent   - Very low risk for Heart Attack  / Stroke  - But Triglycerides ( =  380 ) or fats in blood are too high                 (   Ideal or  Goal is less than 150  !  )    - Recommend avoid fried & greasy foods,  sweets / candy,   - Avoid white rice  (brown or wild rice or Quinoa is OK),   - Avoid white potatoes  (sweet potatoes are OK)   - Avoid anything made from white flour  - bagels, doughnuts, rolls, buns, biscuits, white and   wheat breads, pizza crust and traditional  pasta made of white flour & egg white  - (vegetarian pasta or spinach or wheat pasta is OK).    - Multi-grain bread is OK - like multi-grain flat bread or  sandwich thins.   - Avoid alcohol in excess.   - Exercise is also important. ^>^>^>^>^>^>^>^>^>^>^>^>^>^>^>^>^>^>^>^>^>^>^>^>^>^>^>^>^>^>^>^>^>^>^>^>^  -  Iron levels,  Vitamin B12,      Thyroid      &     A1c -      All Normal & OK  ^>^>^>^>^>^>^>^>^>^>^>^>^>^>^>^>^>^>^>^>^>^>^>^>^>^>^>^>^>^>^>^>^>^>^>^>^ ^>^>^>^>^>^>^>^>^>^>^>^>^>^>^>^>^>^>^>^>^>^>^>^>^>^>^>^>^>^>^>^>^>^>^>^>^  - All Else - CBC - Kidneys - Electrolytes - Liver &  Magnesium  - all  Normal / OK  ^>^>^>^>^>^>^>^>^>^>^>^>^>^>^>^>^>^>^>^>^>^>^>^>^>^>^>^>^>^>^>^>^>^>^>^>^ ^>^>^>^>^>^>^>^>^>^>^>^>^>^>^>^>^>^>^>^>^>^>^>^>^>^>^>^>^>^>^>^>^>^>^>^>^

## 2023-04-26 ENCOUNTER — Other Ambulatory Visit: Payer: Self-pay | Admitting: Internal Medicine

## 2023-04-26 DIAGNOSIS — M7651 Patellar tendinitis, right knee: Secondary | ICD-10-CM

## 2023-04-27 ENCOUNTER — Other Ambulatory Visit: Payer: Self-pay | Admitting: Internal Medicine

## 2023-04-27 DIAGNOSIS — M7651 Patellar tendinitis, right knee: Secondary | ICD-10-CM

## 2023-04-27 MED ORDER — MELOXICAM 15 MG PO TABS: ORAL_TABLET | ORAL | 3 refills | Status: DC

## 2023-04-27 MED ORDER — MELOXICAM 15 MG PO TABS
ORAL_TABLET | ORAL | 3 refills | Status: DC
Start: 2023-04-27 — End: 2024-04-24

## 2023-04-28 ENCOUNTER — Other Ambulatory Visit: Payer: Self-pay | Admitting: Internal Medicine

## 2023-05-14 ENCOUNTER — Other Ambulatory Visit: Payer: Self-pay | Admitting: Internal Medicine

## 2023-05-14 DIAGNOSIS — F1020 Alcohol dependence, uncomplicated: Secondary | ICD-10-CM

## 2023-07-05 ENCOUNTER — Other Ambulatory Visit: Payer: Self-pay | Admitting: Neurology

## 2023-07-06 ENCOUNTER — Encounter: Payer: Self-pay | Admitting: Internal Medicine

## 2023-07-06 ENCOUNTER — Other Ambulatory Visit: Payer: Self-pay | Admitting: Internal Medicine

## 2023-07-06 MED ORDER — MODAFINIL 200 MG PO TABS: ORAL_TABLET | ORAL | 1 refills | Status: DC

## 2023-11-24 ENCOUNTER — Other Ambulatory Visit: Payer: Self-pay

## 2023-11-24 DIAGNOSIS — F1020 Alcohol dependence, uncomplicated: Secondary | ICD-10-CM

## 2023-11-24 MED ORDER — NALTREXONE HCL 50 MG PO TABS
ORAL_TABLET | ORAL | 0 refills | Status: DC
Start: 2023-11-24 — End: 2024-04-24

## 2023-12-07 ENCOUNTER — Other Ambulatory Visit: Payer: Self-pay

## 2023-12-07 DIAGNOSIS — R5383 Other fatigue: Secondary | ICD-10-CM

## 2023-12-07 DIAGNOSIS — F411 Generalized anxiety disorder: Secondary | ICD-10-CM

## 2023-12-07 MED ORDER — SERTRALINE HCL 100 MG PO TABS
ORAL_TABLET | ORAL | 0 refills | Status: DC
Start: 2023-12-07 — End: 2024-03-10

## 2024-01-13 ENCOUNTER — Ambulatory Visit (INDEPENDENT_AMBULATORY_CARE_PROVIDER_SITE_OTHER): Admitting: Family Medicine

## 2024-01-13 ENCOUNTER — Encounter: Payer: Self-pay | Admitting: Family Medicine

## 2024-01-13 VITALS — BP 130/80 | HR 63 | Ht 70.5 in | Wt 160.0 lb

## 2024-01-13 DIAGNOSIS — R1013 Epigastric pain: Secondary | ICD-10-CM | POA: Diagnosis not present

## 2024-01-13 DIAGNOSIS — E782 Mixed hyperlipidemia: Secondary | ICD-10-CM

## 2024-01-13 DIAGNOSIS — F411 Generalized anxiety disorder: Secondary | ICD-10-CM | POA: Diagnosis not present

## 2024-01-13 DIAGNOSIS — F1020 Alcohol dependence, uncomplicated: Secondary | ICD-10-CM

## 2024-01-13 DIAGNOSIS — G4719 Other hypersomnia: Secondary | ICD-10-CM

## 2024-01-13 DIAGNOSIS — Z7689 Persons encountering health services in other specified circumstances: Secondary | ICD-10-CM

## 2024-01-13 DIAGNOSIS — E559 Vitamin D deficiency, unspecified: Secondary | ICD-10-CM

## 2024-01-13 MED ORDER — MODAFINIL 200 MG PO TABS: ORAL_TABLET | ORAL | 1 refills | Status: DC

## 2024-01-13 NOTE — Patient Instructions (Signed)
 It was nice to see you today,  We addressed the following topics today: -Your stomach pain may be due to chronic use of meloxicam .  I would like you to try tapering off this. - You can try stopping the meloxicam  immediately, but if the pain comes back, you can take half a tablet a day for a few weeks and then decrease to half a tablet every other day and then as needed. - During the same time I would also like you to take your omeprazole 40 mg in the morning and Pepcid 20 mg at night.  Do this for 8 weeks before decreasing to 20 mg of omeprazole for 2 weeks and then stopping omeprazole completely until you are only taking Pepcid.  Once you are only taking Pepcid you can then taper off Pepcid the same way.  Have a great day,  Etha Henle, MD

## 2024-01-13 NOTE — Progress Notes (Unsigned)
 New Patient Office Visit  Subjective   Patient ID: Manuel Thomas, male    DOB: 05-Nov-1990  Age: 33 y.o. MRN: [id number]  CC: No chief complaint on file.   HPI RUVEN CORRADI presents to establish care Subjective: - Anxiety, insomnia, alcohol  use - GERD symptoms: severe stomach cramps, burning in upper chest, nausea. Symptoms began 6-7 months ago, initially infrequent but now constant. Worse when lying flat. - Patellar tendinitis: reports pain and swelling previously, now improved - No tobacco use, vapes nicotine - No alcohol  use currently - No recreational drug use - In a relationship - Works as Occupational psychologist at [employer]  Past Medical History: - Sleep disorder requiring modafinil  - Patellar tendinitis - GERD - Family history: grandfather with heart disease, paternal family history of diabetes - No surgical history - Labs from 01/13/2023: mildly elevated triglycerides, otherwise normal  PMH: sleep, anxiety, sbustance use  PSH: ***  FH: ***  Tobacco use: vapes.   Alcohol  use: former  Drug use: no Marital status: in a relationships.  Employment: working- [employer]  Sexual hx: ***  Screenings:  Colon Cancer: *** Lung Cancer: *** Breast Cancer: *** Diabetes: *** HLD: ***   Outpatient Encounter Medications as of 01/13/2024  Medication Sig   Ascorbic Acid (VITAMIN C PO) Take 1 tablet by mouth daily.   aspirin EC 81 MG tablet Take 81 mg by mouth daily. Swallow whole.   Cholecalciferol (VITAMIN D3) 5000 units CAPS Takes 1 capsule daily   Cyanocobalamin  (VITAMIN B-12 PO) Take 1 tablet by mouth.   meloxicam  (MOBIC ) 15 MG tablet Take  1/2 to 1 tablet  Daily  with Food for Pain & Inflammation                                                          /                                                                   TAKE                                         BY                                                 MOUTH   modafinil  (PROVIGIL ) 200 MG  tablet Take  1 tablet  Daily for excessive sleepiness   naltrexone  (DEPADE) 50 MG tablet Take  1 tablet   Daily  to Prevent Alcohol  Cravings                               /  TAKE                                         BY                                                 MOUTH                                             ONCE ? ? ?  DAILY   sertraline  (ZOLOFT ) 100 MG tablet Take 1 tabnlet  Daily for Mood                                                               /                                       TAKE                                     BY                                 MOUTH   zinc gluconate 50 MG tablet Take 50 mg by mouth daily.   No facility-administered encounter medications on file as of 01/13/2024.    Past Medical History:  Diagnosis Date   Acute alcoholic hepatitis 07/29/2016   Asthma    Prediabetes    Vitamin D  deficiency     Past Surgical History:  Procedure Laterality Date   ADENOIDECTOMY     CLOSED REDUCTION FOREARM FRACTURE Left 2005   INGUINAL HERNIA REPAIR  2003   TONSILLECTOMY      Family History  Problem Relation Age of Onset   Heart disease Mother    Heart attack Other    Arthritis Other    Hypertension Other    Diabetes Other    Stroke Other    Alcohol  abuse Paternal Grandfather     Social History   Socioeconomic History   Marital status: Single    Spouse name: Not on file   Number of children: Not on file   Years of education: Not on file   Highest education level: Not on file  Occupational History   Not on file  Tobacco Use   Smoking status: Former    Current packs/day: 0.00    Types: Cigarettes    Quit date: 07/26/2016    Years since quitting: 7.4   Smokeless tobacco: Never  Substance and Sexual Activity   Alcohol  use: No    Comment: 17mo sober   Drug use: No   Sexual activity: Not Currently  Other Topics Concern   Not on file  Social History Narrative   Not on  file    Social Drivers of Health   Financial Resource Strain: Not on file  Food Insecurity: Not on file  Transportation Needs: Not on file  Physical Activity: Not on file  Stress: Not on file  Social Connections: Not on file  Intimate Partner Violence: Not on file    ROS     Objective   There were no vitals taken for this visit.  Physical Exam     Assessment & Plan:   There are no diagnoses linked to this encounter.  No follow-ups on file.   Laneta Pintos, MD

## 2024-01-14 DIAGNOSIS — R1013 Epigastric pain: Secondary | ICD-10-CM | POA: Insufficient documentation

## 2024-01-14 NOTE — Assessment & Plan Note (Signed)
 Continue sertraline

## 2024-01-14 NOTE — Assessment & Plan Note (Signed)
 The patient takes modafinil  for daytime fatigue/somnolence.  He has not had a sleep study in the past that was normal.  Will continue this.

## 2024-01-14 NOTE — Assessment & Plan Note (Signed)
 Patient taking naltrexone .  Was previously prescribed by his PCP.  Will continue this.

## 2024-01-14 NOTE — Assessment & Plan Note (Signed)
 Seems likely related to his meloxicam  use.  Has been using it daily for over a year now.  His knee pain has resolved but he has continued to take the meloxicam .  Discussed stopping or tapering off the meloxicam  and taking a PPI and Pepcid for the next 8 weeks and then tapering off of this.

## 2024-01-24 ENCOUNTER — Other Ambulatory Visit

## 2024-01-24 DIAGNOSIS — R1013 Epigastric pain: Secondary | ICD-10-CM

## 2024-01-24 DIAGNOSIS — E782 Mixed hyperlipidemia: Secondary | ICD-10-CM

## 2024-01-24 DIAGNOSIS — E559 Vitamin D deficiency, unspecified: Secondary | ICD-10-CM

## 2024-01-25 ENCOUNTER — Encounter: Payer: Managed Care, Other (non HMO) | Admitting: Internal Medicine

## 2024-01-25 LAB — BASIC METABOLIC PANEL WITH GFR
BUN/Creatinine Ratio: 13 (ref 9–20)
BUN: 13 mg/dL (ref 6–20)
CO2: 23 mmol/L (ref 20–29)
Calcium: 9.8 mg/dL (ref 8.7–10.2)
Chloride: 101 mmol/L (ref 96–106)
Creatinine, Ser: 1.03 mg/dL (ref 0.76–1.27)
Glucose: 85 mg/dL (ref 70–99)
Potassium: 4.7 mmol/L (ref 3.5–5.2)
Sodium: 140 mmol/L (ref 134–144)
eGFR: 99 mL/min/{1.73_m2} (ref >59)

## 2024-01-25 LAB — CBC WITH DIFFERENTIAL/PLATELET
Basophils Absolute: 0.1 10*3/uL (ref 0.0–0.2)
Basos: 1 %
EOS (ABSOLUTE): 0.3 10*3/uL (ref 0.0–0.4)
Eos: 5 %
Hematocrit: 47.2 % (ref 37.5–51.0)
Hemoglobin: 15.1 g/dL (ref 13.0–17.7)
Immature Grans (Abs): 0 10*3/uL (ref 0.0–0.1)
Immature Granulocytes: 1 %
Lymphocytes Absolute: 2.3 10*3/uL (ref 0.7–3.1)
Lymphs: 37 %
MCH: 28.4 pg (ref 26.6–33.0)
MCHC: 32 g/dL (ref 31.5–35.7)
MCV: 89 fL (ref 79–97)
Monocytes Absolute: 0.5 10*3/uL (ref 0.1–0.9)
Monocytes: 8 %
Neutrophils Absolute: 3.1 10*3/uL (ref 1.4–7.0)
Neutrophils: 48 %
Platelets: 251 10*3/uL (ref 150–450)
RBC: 5.32 x10E6/uL (ref 4.14–5.80)
RDW: 13.1 % (ref 11.6–15.4)
WBC: 6.3 10*3/uL (ref 3.4–10.8)

## 2024-01-25 LAB — LIPID PANEL
Chol/HDL Ratio: 6.3 ratio — ABNORMAL HIGH (ref 0.0–5.0)
Cholesterol, Total: 266 mg/dL — ABNORMAL HIGH (ref 100–199)
HDL: 42 mg/dL (ref >39)
LDL Chol Calc (NIH): 190 mg/dL — ABNORMAL HIGH (ref 0–99)
Triglycerides: 181 mg/dL — ABNORMAL HIGH (ref 0–149)
VLDL Cholesterol Cal: 34 mg/dL (ref 5–40)

## 2024-01-25 LAB — VITAMIN D 25 HYDROXY (VIT D DEFICIENCY, FRACTURES): Vit D, 25-Hydroxy: 61.7 ng/mL (ref 30.0–100.0)

## 2024-01-26 ENCOUNTER — Encounter: Payer: Self-pay | Admitting: Family Medicine

## 2024-02-04 ENCOUNTER — Telehealth: Payer: Self-pay | Admitting: *Deleted

## 2024-02-04 NOTE — Telephone Encounter (Signed)
 Copied from CRM [id number]. Topic: Clinical - Request for Lab/Test Order >> Feb 04, 2024 10:43 AM El Gravely T wrote: Reason for CRM: Patient calling to schedule appointment for labs.   Unable to schedule appointment at time of all due to not seeing orders for labs.  Please contact patient to schedule appointment.

## 2024-02-04 NOTE — Telephone Encounter (Signed)
 Lab appt scheduled.

## 2024-02-09 ENCOUNTER — Other Ambulatory Visit: Payer: Self-pay | Admitting: *Deleted

## 2024-02-09 DIAGNOSIS — E782 Mixed hyperlipidemia: Secondary | ICD-10-CM

## 2024-02-10 ENCOUNTER — Other Ambulatory Visit

## 2024-02-10 DIAGNOSIS — E782 Mixed hyperlipidemia: Secondary | ICD-10-CM

## 2024-02-11 LAB — COMPREHENSIVE METABOLIC PANEL WITH GFR
ALT: 12 IU/L (ref 0–44)
AST: 14 IU/L (ref 0–40)
Albumin: 4.7 g/dL (ref 4.1–5.1)
Alkaline Phosphatase: 75 IU/L (ref 44–121)
BUN/Creatinine Ratio: 12 (ref 9–20)
BUN: 12 mg/dL (ref 6–20)
Bilirubin Total: 0.3 mg/dL (ref 0.0–1.2)
CO2: 25 mmol/L (ref 20–29)
Calcium: 9.5 mg/dL (ref 8.7–10.2)
Chloride: 99 mmol/L (ref 96–106)
Creatinine, Ser: 1.03 mg/dL (ref 0.76–1.27)
Globulin, Total: 2.3 g/dL (ref 1.5–4.5)
Glucose: 85 mg/dL (ref 70–99)
Potassium: 4.4 mmol/L (ref 3.5–5.2)
Sodium: 140 mmol/L (ref 134–144)
Total Protein: 7 g/dL (ref 6.0–8.5)
eGFR: 99 mL/min/{1.73_m2} (ref >59)

## 2024-02-11 LAB — TSH: TSH: 2.1 u[IU]/mL (ref 0.450–4.500)

## 2024-02-11 LAB — LIPID PANEL
Chol/HDL Ratio: 5.9 ratio — ABNORMAL HIGH (ref 0.0–5.0)
Cholesterol, Total: 248 mg/dL — ABNORMAL HIGH (ref 100–199)
HDL: 42 mg/dL (ref >39)
LDL Chol Calc (NIH): 175 mg/dL — ABNORMAL HIGH (ref 0–99)
Triglycerides: 167 mg/dL — ABNORMAL HIGH (ref 0–149)
VLDL Cholesterol Cal: 31 mg/dL (ref 5–40)

## 2024-02-18 ENCOUNTER — Ambulatory Visit: Payer: Self-pay | Admitting: Family Medicine

## 2024-02-21 ENCOUNTER — Other Ambulatory Visit: Payer: Self-pay | Admitting: Family

## 2024-02-21 DIAGNOSIS — F1020 Alcohol dependence, uncomplicated: Secondary | ICD-10-CM

## 2024-03-06 ENCOUNTER — Other Ambulatory Visit: Payer: Self-pay | Admitting: Family

## 2024-03-06 DIAGNOSIS — F411 Generalized anxiety disorder: Secondary | ICD-10-CM

## 2024-03-06 DIAGNOSIS — R5383 Other fatigue: Secondary | ICD-10-CM

## 2024-03-08 ENCOUNTER — Telehealth: Payer: Self-pay

## 2024-03-08 NOTE — Telephone Encounter (Signed)
 Copied from CRM [id number]. Topic: Clinical - Medication Question >> Mar 08, 2024  2:44 PM Fonda T wrote: Reason for CRM: Patient calling checking status of medication, sertraline  (ZOLOFT ) 100 MG.  Patient states pharmacy sent refill request, per chart indicates refill is pending. Informed patient of 3 business day time to process medication refill requests.   Per patient states he is out of medication, and wants know if medication can be filled sooner.  Patient can be reached at [phone].   Preferred Pharmacy:  [facility] [address] [place], [place] - [address] [place] [address] Phone: [phone] Fax: [phone]

## 2024-03-10 NOTE — Telephone Encounter (Unsigned)
 Copied from CRM [id number]. Topic: Clinical - Medical Advice >> Mar 10, 2024 10:35 AM Tiffini S wrote: Reason for CRM: Patient is out of medication for sertraline  (ZOLOFT ) 100 MG tablet, said he was told to call back if refill was not ready at the pharmacy. Patient is asking for a call back at [phone].

## 2024-04-24 ENCOUNTER — Encounter: Payer: Self-pay | Admitting: Family Medicine

## 2024-04-24 ENCOUNTER — Ambulatory Visit (INDEPENDENT_AMBULATORY_CARE_PROVIDER_SITE_OTHER): Admitting: Family Medicine

## 2024-04-24 VITALS — BP 113/77 | HR 74 | Ht 70.5 in | Wt 167.8 lb

## 2024-04-24 DIAGNOSIS — R5383 Other fatigue: Secondary | ICD-10-CM | POA: Diagnosis not present

## 2024-04-24 DIAGNOSIS — F411 Generalized anxiety disorder: Secondary | ICD-10-CM

## 2024-04-24 DIAGNOSIS — F1020 Alcohol dependence, uncomplicated: Secondary | ICD-10-CM

## 2024-04-24 DIAGNOSIS — E782 Mixed hyperlipidemia: Secondary | ICD-10-CM | POA: Diagnosis not present

## 2024-04-24 DIAGNOSIS — R1013 Epigastric pain: Secondary | ICD-10-CM

## 2024-04-24 DIAGNOSIS — G4719 Other hypersomnia: Secondary | ICD-10-CM

## 2024-04-24 MED ORDER — SERTRALINE HCL 100 MG PO TABS: ORAL_TABLET | ORAL | 1 refills | Status: AC

## 2024-04-24 MED ORDER — MODAFINIL 200 MG PO TABS: ORAL_TABLET | ORAL | 1 refills | Status: AC

## 2024-04-24 NOTE — Assessment & Plan Note (Signed)
 Continue sertraline.  100 mg daily

## 2024-04-24 NOTE — Assessment & Plan Note (Signed)
 No longer taking naltrexone .  No worsening of cravings since stopping.

## 2024-04-24 NOTE — Assessment & Plan Note (Signed)
 Presents for follow-up of significantly elevated cholesterol noted on labs from early May. Previous LDL was normal. Labs ruled out thyroid issues as a cause. Increased exercise since last visit. No other risk factors such as obesity, diabetes, hypertension, or smoking. Family history is not significant for premature cardiovascular disease. - Schedule a fasting lipid panel in the next few weeks. - If LDL is >190, will initiate statin therapy. - Discussed other risk factors and guidelines for treatment.

## 2024-04-24 NOTE — Assessment & Plan Note (Signed)
 Epigastric pain has resolved since discontinuing meloxicam  and completing a course of PPI/H2 blocker therapy. Currently taking famotidine 20mg  daily. - Taper off famotidine by either taking half a tablet daily for two weeks then stopping, or taking one full tablet every other day for two weeks then stopping. - May use as needed for any breakthrough symptoms. If symptoms return, can continue at lowest effective dose.

## 2024-04-24 NOTE — Patient Instructions (Signed)
 It was nice to see you today,  We addressed the following topics today: -You can taper off your Pepcid by either cutting it in half and taking half a tablet daily for a few weeks and then stopping or taking a full tablet every other day for 2 weeks and then stopping. - If symptoms return after completely stopping, you can either use it as needed or continue using at the lowest effective dose - We are going to check your cholesterol levels.  If they are still significantly elevated we will start a cholesterol medication like we discussed.  Otherwise we will follow-up with you in 6 months.  Have a great day,  Rolan Slain, MD

## 2024-04-24 NOTE — Assessment & Plan Note (Signed)
 Continue modafanil

## 2024-04-24 NOTE — Progress Notes (Unsigned)
 Established Patient Office Visit  Subjective   Patient ID: Manuel Thomas, male    DOB: 10-26-1990  Age: 33 y.o. MRN: [id number]  Chief Complaint  Patient presents with   Medical Management of Chronic Issues    HPI  Subjective - Follow-up for medication management and review of lab results. - Reports no new concerns. - Stopped taking naltrexone  without recurrence of alcohol  cravings. - Epigastric abdominal pain has resolved since stopping meloxicam . - Weaned off omeprazole. Continues famotidine 20mg  daily. - Increased exercise, including more walks.  Medications Currently taking modafinil , sertraline , and famotidine 20mg  daily. Stopped naltrexone  and meloxicam . Previously took omeprazole. Requests refills for methylphenidate and sertraline .  PMH, PSH, FH, Social Hx PMHx: Hyperlipidemia, history of stomach ulcers secondary to NSAID use, ankle pain. FHx: No significant family history of extremely high cholesterol. Social Hx: Stopped alcohol  use in November 2017.  ROS GI: Epigastric pain has resolved. No symptoms of acid reflux.    The ASCVD Risk score (Arnett DK, et al., 2019) failed to calculate for the following reasons:   The 2019 ASCVD risk score is only valid for ages 28 to 8  Health Maintenance Due  Topic Date Due   Pneumococcal Vaccine: 19-49 Years (1 of 2 - PCV) Never done   Hepatitis B Vaccines (1 of 3 - 19+ 3-dose series) Never done   HPV VACCINES (1 - 3-dose SCDM series) Never done   DTaP/Tdap/Td (3 - Tdap) 09/20/2022   COVID-19 Vaccine (4 - 2024-25 season) 05/23/2023   INFLUENZA VACCINE  04/21/2024      Objective:     BP 113/77   Pulse 74   Ht 5' 10.5 (1.791 m)   Wt 167 lb 12.8 oz (76.1 kg)   SpO2 97%   BMI 23.74 kg/m  {Vitals History (Optional):23777}  Physical Exam Gen: alert, oriented Pulm: no resp distress Pscyh: pleasant affect.    No results found for any visits on 04/24/24.      Assessment & Plan:   Mixed  hyperlipidemia Assessment & Plan: Presents for follow-up of significantly elevated cholesterol noted on labs from early May. Previous LDL was normal. Labs ruled out thyroid issues as a cause. Increased exercise since last visit. No other risk factors such as obesity, diabetes, hypertension, or smoking. Family history is not significant for premature cardiovascular disease. - Schedule a fasting lipid panel in the next few weeks. - If LDL is >190, will initiate statin therapy. - Discussed other risk factors and guidelines for treatment.   Anxiety state -     Sertraline  HCl; TAKE 1 TABLET BY MOUTH ONCE DAILY FOR  MOOD  Dispense: 90 tablet; Refill: 1  Other fatigue -     Sertraline  HCl; TAKE 1 TABLET BY MOUTH ONCE DAILY FOR  MOOD  Dispense: 90 tablet; Refill: 1  Epigastric pain Assessment & Plan: Epigastric pain has resolved since discontinuing meloxicam  and completing a course of PPI/H2 blocker therapy. Currently taking famotidine 20mg  daily. - Taper off famotidine by either taking half a tablet daily for two weeks then stopping, or taking one full tablet every other day for two weeks then stopping. - May use as needed for any breakthrough symptoms. If symptoms return, can continue at lowest effective dose.   Anxiety neurosis Assessment & Plan: Continue sertraline  100mg  daily   Excessive daytime sleepiness Assessment & Plan: Continue modafanil   Alcohol  use disorder, severe, dependence (HCC) Assessment & Plan: No longer taking naltrexone .  No worsening of cravings since stopping.  Other orders -     Modafinil ; Take  1 tablet  Daily for excessive sleepiness  Dispense: 90 tablet; Refill: 1     Return in about 6 months (around 10/25/2024) for physical.    Toribio MARLA Slain, MD

## 2024-05-02 ENCOUNTER — Other Ambulatory Visit

## 2024-05-02 DIAGNOSIS — E782 Mixed hyperlipidemia: Secondary | ICD-10-CM

## 2024-05-03 ENCOUNTER — Ambulatory Visit: Payer: Self-pay | Admitting: Family Medicine

## 2024-05-03 DIAGNOSIS — E782 Mixed hyperlipidemia: Secondary | ICD-10-CM

## 2024-05-03 LAB — LIPID PANEL
Chol/HDL Ratio: 6.5 ratio — ABNORMAL HIGH (ref 0.0–5.0)
Cholesterol, Total: 261 mg/dL — ABNORMAL HIGH (ref 100–199)
HDL: 40 mg/dL (ref >39)
LDL Chol Calc (NIH): 171 mg/dL — ABNORMAL HIGH (ref 0–99)
Triglycerides: 264 mg/dL — ABNORMAL HIGH (ref 0–149)
VLDL Cholesterol Cal: 50 mg/dL — ABNORMAL HIGH (ref 5–40)

## 2024-05-03 LAB — APOLIPOPROTEIN B: Apolipoprotein B: 139 mg/dL — ABNORMAL HIGH (ref <90)

## 2024-05-03 MED ORDER — ROSUVASTATIN CALCIUM 5 MG PO TABS: 5.0000 mg | ORAL_TABLET | Freq: Every day | ORAL | 3 refills | Status: DC

## 2024-05-08 ENCOUNTER — Other Ambulatory Visit: Payer: Self-pay | Admitting: *Deleted

## 2024-05-08 MED ORDER — ROSUVASTATIN CALCIUM 5 MG PO TABS
5.0000 mg | ORAL_TABLET | Freq: Every day | ORAL | 3 refills | Status: AC
Start: 2024-05-08 — End: ?

## 2024-05-08 NOTE — Telephone Encounter (Signed)
 I have resent the patients crestor  to the [facility] pharmacy per his request.

## 2024-06-26 ENCOUNTER — Other Ambulatory Visit

## 2024-06-26 DIAGNOSIS — E782 Mixed hyperlipidemia: Secondary | ICD-10-CM

## 2024-06-27 ENCOUNTER — Ambulatory Visit: Payer: Self-pay

## 2024-06-27 LAB — LIPID PANEL
Chol/HDL Ratio: 4.7 ratio (ref 0.0–5.0)
Cholesterol, Total: 186 mg/dL (ref 100–199)
HDL: 40 mg/dL (ref >39)
LDL Chol Calc (NIH): 118 mg/dL — ABNORMAL HIGH (ref 0–99)
Triglycerides: 156 mg/dL — ABNORMAL HIGH (ref 0–149)
VLDL Cholesterol Cal: 28 mg/dL (ref 5–40)

## 2024-06-27 LAB — APOLIPOPROTEIN B: Apolipoprotein B: 105 mg/dL — ABNORMAL HIGH (ref <90)

## 2024-10-19 ENCOUNTER — Other Ambulatory Visit: Payer: Self-pay | Admitting: Family Medicine

## 2024-10-19 DIAGNOSIS — J45909 Unspecified asthma, uncomplicated: Secondary | ICD-10-CM

## 2024-10-19 DIAGNOSIS — E559 Vitamin D deficiency, unspecified: Secondary | ICD-10-CM

## 2024-10-19 DIAGNOSIS — Z131 Encounter for screening for diabetes mellitus: Secondary | ICD-10-CM

## 2024-10-19 DIAGNOSIS — E782 Mixed hyperlipidemia: Secondary | ICD-10-CM

## 2024-10-23 ENCOUNTER — Other Ambulatory Visit

## 2024-10-30 ENCOUNTER — Encounter: Admitting: Family Medicine
